# Patient Record
Sex: Male | Born: 1970 | Race: White | Hispanic: No | Marital: Single
Health system: Midwestern US, Community
[De-identification: ages and names within clinical notes are randomized; demographics above are authoritative.]

## PROBLEM LIST (undated history)

## (undated) DIAGNOSIS — M259 Joint disorder, unspecified: Secondary | ICD-10-CM

## (undated) DIAGNOSIS — M1380 Other specified arthritis, unspecified site: Secondary | ICD-10-CM

## (undated) DIAGNOSIS — Z6835 Body mass index (BMI) 35.0-35.9, adult: Secondary | ICD-10-CM

## (undated) DIAGNOSIS — E875 Hyperkalemia: Principal | ICD-10-CM

## (undated) DIAGNOSIS — E66812 Obesity, class 2: Principal | ICD-10-CM

## (undated) DIAGNOSIS — R1011 Right upper quadrant pain: Secondary | ICD-10-CM

## (undated) DIAGNOSIS — I2510 Atherosclerotic heart disease of native coronary artery without angina pectoris: Principal | ICD-10-CM

## (undated) DIAGNOSIS — Z6838 Body mass index (BMI) 38.0-38.9, adult: Secondary | ICD-10-CM

## (undated) DIAGNOSIS — K219 Gastro-esophageal reflux disease without esophagitis: Secondary | ICD-10-CM

## (undated) DIAGNOSIS — E559 Vitamin D deficiency, unspecified: Secondary | ICD-10-CM

## (undated) DIAGNOSIS — E6609 Other obesity due to excess calories: Secondary | ICD-10-CM

## (undated) HISTORY — PX: KNEE SURGERY: SHX244

## (undated) HISTORY — PX: BACK SURGERY: SHX140

## (undated) HISTORY — PX: WRIST SURGERY: SHX841

## (undated) HISTORY — PX: FRACTURE SURGERY: SHX138

---

## 2016-02-24 ENCOUNTER — Observation Stay
Admission: EM | Admit: 2016-02-24 | Discharge: 2016-03-01 | Disposition: A | Discharge status: Home / Self Care | Payer: Self-pay | Attending: Family Medicine | Admitting: Family Medicine

## 2016-02-24 ENCOUNTER — Emergency Department: Payer: Self-pay

## 2016-02-24 ENCOUNTER — Observation Stay: Payer: Self-pay | Admitting: Internal Medicine

## 2016-02-24 DIAGNOSIS — I10 Essential (primary) hypertension: Secondary | ICD-10-CM | POA: Insufficient documentation

## 2016-02-24 DIAGNOSIS — I209 Angina pectoris, unspecified: Secondary | ICD-10-CM

## 2016-02-24 DIAGNOSIS — R072 Precordial pain: Secondary | ICD-10-CM

## 2016-02-24 DIAGNOSIS — H532 Diplopia: Secondary | ICD-10-CM | POA: Insufficient documentation

## 2016-02-24 DIAGNOSIS — H4910 Fourth [trochlear] nerve palsy, unspecified eye: Secondary | ICD-10-CM | POA: Diagnosis present

## 2016-02-24 DIAGNOSIS — S40812A Abrasion of left upper arm, initial encounter: Secondary | ICD-10-CM

## 2016-02-24 DIAGNOSIS — S060X9A Concussion with loss of consciousness of unspecified duration, initial encounter: Secondary | ICD-10-CM | POA: Insufficient documentation

## 2016-02-24 DIAGNOSIS — S50812A Abrasion of left forearm, initial encounter: Secondary | ICD-10-CM | POA: Insufficient documentation

## 2016-02-24 DIAGNOSIS — E669 Obesity, unspecified: Secondary | ICD-10-CM | POA: Insufficient documentation

## 2016-02-24 DIAGNOSIS — S0990XA Unspecified injury of head, initial encounter: Secondary | ICD-10-CM

## 2016-02-24 DIAGNOSIS — R0789 Other chest pain: Principal | ICD-10-CM | POA: Insufficient documentation

## 2016-02-24 DIAGNOSIS — E785 Hyperlipidemia, unspecified: Secondary | ICD-10-CM | POA: Insufficient documentation

## 2016-02-24 DIAGNOSIS — Y9389 Activity, other specified: Secondary | ICD-10-CM | POA: Insufficient documentation

## 2016-02-24 DIAGNOSIS — Y92410 Unspecified street and highway as the place of occurrence of the external cause: Secondary | ICD-10-CM | POA: Insufficient documentation

## 2016-02-24 DIAGNOSIS — Y998 Other external cause status: Secondary | ICD-10-CM | POA: Insufficient documentation

## 2016-02-24 DIAGNOSIS — R55 Syncope and collapse: Secondary | ICD-10-CM | POA: Insufficient documentation

## 2016-02-24 DIAGNOSIS — R079 Chest pain, unspecified: Secondary | ICD-10-CM | POA: Diagnosis not present

## 2016-02-24 DIAGNOSIS — S20212A Contusion of left front wall of thorax, initial encounter: Secondary | ICD-10-CM

## 2016-02-24 DIAGNOSIS — S40212A Abrasion of left shoulder, initial encounter: Secondary | ICD-10-CM | POA: Insufficient documentation

## 2016-02-24 LAB — I-STAT CHEM 8 CARTRIDGE
Anion Gap I-Stat: 17 — ABNORMAL HIGH (ref 7–16)
BUN I-Stat: 10 mg/dL (ref 6–20)
Calcium Ionized I-Stat: 4.8 mg/dL (ref 4.35–5.10)
Chloride I-Stat: 101 mMol/L (ref 98–112)
Creatinine I-Stat: 0.9 mg/dL (ref 0.90–1.30)
EGFR: 105 mL/min/{1.73_m2} (ref 60–150)
Glucose I-Stat: 99 mg/dL (ref 70–99)
Hematocrit I-Stat: 44 % (ref 39.0–52.5)
Hemoglobin I-Stat: 15 gm/dL (ref 13.0–17.5)
Potassium I-Stat: 3.5 mMol/L (ref 3.5–5.3)
Sodium I-Stat: 139 mMol/L (ref 135–145)
TCO2 I-Stat: 25 mMol/L (ref 24–29)

## 2016-02-24 LAB — VH URINE DRUG SCREEN
Amphetamine: NEGATIVE
Barbiturates: NEGATIVE
Buprenorphine, Urine: NEGATIVE
Cannabinoids: NEGATIVE
Cocaine: NEGATIVE
Opiates: NEGATIVE
Phencyclidine: NEGATIVE
Urine Benzodiazepines: NEGATIVE
Urine Creatinine Random: 77.84 mg/dL
Urine Methadone Screen: NEGATIVE
Urine Oxycodone: NEGATIVE
Urine Specific Gravity: 1.013 (ref 1.001–1.040)
pH, Urine: 6.8 pH (ref 5.0–8.0)

## 2016-02-24 LAB — ECG 12-LEAD
P Wave Axis: 30 deg
P-R Interval: 155 ms
Patient Age: 44 years
Q-T Interval(Corrected): 435 ms
Q-T Interval: 359 ms
QRS Axis: -39 deg
QRS Duration: 111 ms
T Axis: 30 years
Ventricular Rate: 88 //min

## 2016-02-24 LAB — CBC AND DIFFERENTIAL
Basophils %: 0.4 % (ref 0.0–3.0)
Basophils Absolute: 0.1 10*3/uL (ref 0.0–0.3)
Eosinophils %: 1.2 % (ref 0.0–7.0)
Eosinophils Absolute: 0.2 10*3/uL (ref 0.0–0.8)
Hematocrit: 45.8 % (ref 39.0–52.5)
Hemoglobin: 15.2 gm/dL (ref 13.0–17.5)
Lymphocytes Absolute: 2.7 10*3/uL (ref 0.6–5.1)
Lymphocytes: 18 % (ref 15.0–46.0)
MCH: 29 pg (ref 28–35)
MCHC: 33 gm/dL (ref 32–36)
MCV: 86 fL (ref 80–100)
MPV: 7.5 fL (ref 6.0–10.0)
Monocytes Absolute: 0.8 10*3/uL (ref 0.1–1.7)
Monocytes: 5.7 % (ref 3.0–15.0)
Neutrophils %: 74.7 % (ref 42.0–78.0)
Neutrophils Absolute: 11 10*3/uL — ABNORMAL HIGH (ref 1.7–8.6)
PLT CT: 363 10*3/uL (ref 130–440)
RBC: 5.31 10*6/uL (ref 4.00–5.70)
RDW: 12.1 % (ref 11.0–14.0)
WBC: 14.8 10*3/uL — ABNORMAL HIGH (ref 4.0–11.0)

## 2016-02-24 LAB — I-STAT TROPONIN: Troponin I I-Stat: <0.02 ng/mL (ref 0.00–0.02)

## 2016-02-24 LAB — VH I-STAT TROPONIN NOTIFICATION

## 2016-02-24 LAB — VH I-STAT CHEM 8 NOTIFICATION

## 2016-02-24 MED ORDER — DIPHENHYDRAMINE HCL 50 MG/ML IJ SOLN
INTRAMUSCULAR | Status: AC
Start: 2016-02-24 — End: ?
  Filled 2016-02-24: qty 1

## 2016-02-24 MED ORDER — ONDANSETRON HCL 4 MG/2ML IJ SOLN
INTRAMUSCULAR | Status: AC
Start: 2016-02-24 — End: ?
  Filled 2016-02-24: qty 2

## 2016-02-24 MED ORDER — ONDANSETRON HCL 4 MG/2ML IJ SOLN
4.0000 mg | Freq: Once | INTRAMUSCULAR | Status: AC
Start: 2016-02-24 — End: 2016-02-24
  Administered 2016-02-24: 4 mg via INTRAVENOUS

## 2016-02-24 MED ORDER — DIPHENHYDRAMINE HCL 50 MG/ML IJ SOLN
25.0000 mg | Freq: Once | INTRAMUSCULAR | Status: AC
Start: 2016-02-24 — End: 2016-02-24
  Administered 2016-02-24: 25 mg via INTRAVENOUS

## 2016-02-24 MED ORDER — IOHEXOL 350 MG/ML IV SOLN
100.0000 mL | Freq: Once | INTRAVENOUS | Status: AC | PRN
Start: 2016-02-24 — End: 2016-02-24
  Administered 2016-02-24: 100 mL via INTRAVENOUS

## 2016-02-24 MED ORDER — MORPHINE SULFATE (PF) 2 MG/ML IV SOLN
INTRAVENOUS | Status: AC
Start: 2016-02-24 — End: ?
  Filled 2016-02-24: qty 2

## 2016-02-24 MED ORDER — MORPHINE SULFATE 2 MG/ML IJ/IV SOLN (WRAP)
4.0000 mg | Freq: Once | Status: AC
Start: 2016-02-24 — End: 2016-02-24
  Administered 2016-02-24: 4 mg via INTRAVENOUS
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
## 2016-02-24 NOTE — ED Notes (Signed)
c-collar taken off by Dr. Kerrie Pleasure. Pt A&Ox4.

## 2016-02-25 ENCOUNTER — Observation Stay: Payer: Self-pay

## 2016-02-25 DIAGNOSIS — E669 Obesity, unspecified: Secondary | ICD-10-CM | POA: Diagnosis present

## 2016-02-25 DIAGNOSIS — H4910 Fourth [trochlear] nerve palsy, unspecified eye: Secondary | ICD-10-CM | POA: Diagnosis present

## 2016-02-25 LAB — COMPREHENSIVE METABOLIC PANEL
ALT: 29 U/L (ref 0–55)
AST (SGOT): 21 U/L (ref 10–42)
Albumin/Globulin Ratio: 1.03 Ratio (ref 0.70–1.50)
Albumin: 3.4 gm/dL — ABNORMAL LOW (ref 3.5–5.0)
Alkaline Phosphatase: 111 U/L (ref 40–145)
Anion Gap: 12.6 mMol/L (ref 7.0–18.0)
BUN / Creatinine Ratio: 11.8 Ratio (ref 10.0–30.0)
BUN: 10 mg/dL (ref 7–22)
Bilirubin, Total: 0.5 mg/dL (ref 0.1–1.2)
CO2: 23.9 mMol/L (ref 20.0–30.0)
Calcium: 9.2 mg/dL (ref 8.5–10.5)
Chloride: 103 mMol/L (ref 98–110)
Creatinine: 0.85 mg/dL (ref 0.80–1.30)
EGFR: 107 mL/min/{1.73_m2} (ref 60–150)
Globulin: 3.3 gm/dL (ref 2.0–4.0)
Glucose: 106 mg/dL — ABNORMAL HIGH (ref 70–99)
Osmolality Calc: 270 mOsm/kg — ABNORMAL LOW (ref 275–300)
Potassium: 4.5 mMol/L (ref 3.5–5.3)
Protein, Total: 6.7 gm/dL (ref 6.0–8.3)
Sodium: 135 mMol/L — ABNORMAL LOW (ref 136–147)

## 2016-02-25 LAB — TROPONIN I
Troponin I: 0.01 ng/mL (ref 0.00–0.02)
Troponin I: 0.01 ng/mL (ref 0.00–0.02)
Troponin I: 0.01 ng/mL (ref 0.00–0.02)

## 2016-02-25 LAB — CBC AND DIFFERENTIAL
Basophils %: 0.1 % (ref 0.0–3.0)
Basophils Absolute: 0 10*3/uL (ref 0.0–0.3)
Eosinophils %: 0.2 % (ref 0.0–7.0)
Eosinophils Absolute: 0 10*3/uL (ref 0.0–0.8)
Hematocrit: 44.2 % (ref 39.0–52.5)
Hemoglobin: 15.1 gm/dL (ref 13.0–17.5)
Lymphocytes Absolute: 2.6 10*3/uL (ref 0.6–5.1)
Lymphocytes: 14.8 % — ABNORMAL LOW (ref 15.0–46.0)
MCH: 30 pg (ref 28–35)
MCHC: 34 gm/dL (ref 32–36)
MCV: 87 fL (ref 80–100)
MPV: 7.5 fL (ref 6.0–10.0)
Monocytes Absolute: 0.9 10*3/uL (ref 0.1–1.7)
Monocytes: 4.8 % (ref 3.0–15.0)
Neutrophils %: 80 % — ABNORMAL HIGH (ref 42.0–78.0)
Neutrophils Absolute: 14.1 10*3/uL — ABNORMAL HIGH (ref 1.7–8.6)
PLT CT: 330 10*3/uL (ref 130–440)
RBC: 5.07 10*6/uL (ref 4.00–5.70)
RDW: 12.1 % (ref 11.0–14.0)
WBC: 17.6 10*3/uL — ABNORMAL HIGH (ref 4.0–11.0)

## 2016-02-25 LAB — LIPID PANEL
Cholesterol: 173 mg/dL (ref 75–199)
Coronary Heart Disease Risk: 6.92
HDL: 25 mg/dL — ABNORMAL LOW (ref 40–55)
LDL Calculated: 93 mg/dL
Triglycerides: 274 mg/dL — ABNORMAL HIGH (ref 10–150)
VLDL: 55 — ABNORMAL HIGH (ref 0–40)

## 2016-02-25 LAB — HEMOGLOBIN A1C: Hgb A1C, %: 5 %

## 2016-02-25 MED ORDER — EYE PATCH MISC
1.0000 | Freq: Two times a day (BID) | Status: DC
Start: 2016-02-25 — End: 2016-03-01
  Administered 2016-02-25 – 2016-03-01 (×10): 1
  Filled 2016-02-25 (×4): qty 1

## 2016-02-25 MED ORDER — ALPRAZOLAM 0.25 MG PO TABS
0.2500 mg | ORAL_TABLET | Freq: Three times a day (TID) | ORAL | Status: DC | PRN
Start: 2016-02-25 — End: 2016-03-01

## 2016-02-25 MED ORDER — SODIUM CHLORIDE 0.9 % IJ SOLN
0.4000 mg | INTRAMUSCULAR | Status: DC | PRN
Start: 2016-02-25 — End: 2016-03-01

## 2016-02-25 MED ORDER — ONDANSETRON 4 MG PO TBDP
4.0000 mg | ORAL_TABLET | Freq: Three times a day (TID) | ORAL | Status: DC | PRN
Start: 2016-02-25 — End: 2016-03-01

## 2016-02-25 MED ORDER — NITROGLYCERIN 0.4 MG SL SUBL
0.4000 mg | SUBLINGUAL_TABLET | SUBLINGUAL | Status: DC | PRN
Start: 2016-02-25 — End: 2016-03-01

## 2016-02-25 MED ORDER — LEVETIRACETAM IN NACL 1000 MG/100ML IV SOLN
1000.0000 mg | Freq: Two times a day (BID) | INTRAVENOUS | Status: DC
Start: 2016-02-25 — End: 2016-02-25
  Administered 2016-02-25: 1000 mg via INTRAVENOUS
  Filled 2016-02-25 (×2): qty 100

## 2016-02-25 MED ORDER — VH PERFLUTREN LIPID MICROSPHERE 6.52 MG/ML IV SUSP
INTRAVENOUS | Status: AC
Start: 2016-02-25 — End: ?
  Filled 2016-02-25: qty 2

## 2016-02-25 MED ORDER — VH HEPARIN SODIUM (PORCINE) 5000 UNIT/ML IJ SOLN
5000.0000 [IU] | Freq: Two times a day (BID) | INTRAMUSCULAR | Status: DC
Start: 2016-02-25 — End: 2016-03-01
  Administered 2016-02-25 – 2016-02-29 (×12): 5000 [IU] via SUBCUTANEOUS
  Filled 2016-02-25 (×15): qty 1

## 2016-02-25 MED ORDER — LORAZEPAM 2 MG/ML IJ SOLN
1.0000 mg | INTRAMUSCULAR | Status: DC | PRN
Start: 2016-02-25 — End: 2016-03-01

## 2016-02-25 MED ORDER — PROCHLORPERAZINE EDISYLATE 5 MG/ML IJ SOLN
10.0000 mg | INTRAMUSCULAR | Status: DC | PRN
Start: 2016-02-25 — End: 2016-03-01

## 2016-02-25 MED ORDER — ACETAMINOPHEN 160 MG/5ML PO SOLN
650.0000 mg | ORAL | Status: DC | PRN
Start: 2016-02-25 — End: 2016-03-01

## 2016-02-25 MED ORDER — VH PERFLUTREN LIPID MICROSPHERE 6.52 MG/ML IV SUSP
Freq: Once | INTRAVENOUS | Status: AC
Start: 2016-02-25 — End: 2016-02-25
  Administered 2016-02-25: 2 mL via INTRAVENOUS

## 2016-02-25 MED ORDER — FAMOTIDINE 20 MG PO TABS
20.0000 mg | ORAL_TABLET | Freq: Two times a day (BID) | ORAL | Status: DC
Start: 2016-02-25 — End: 2016-03-01
  Administered 2016-02-25 – 2016-03-01 (×13): 20 mg via ORAL
  Filled 2016-02-25 (×16): qty 1

## 2016-02-25 MED ORDER — DIAZEPAM 10 MG RE GEL
5.0000 mg | RECTAL | Status: DC | PRN
Start: 2016-02-25 — End: 2016-03-01
  Filled 2016-02-25: qty 10

## 2016-02-25 MED ORDER — ACETAMINOPHEN 650 MG RE SUPP
650.0000 mg | RECTAL | Status: DC | PRN
Start: 2016-02-25 — End: 2016-03-01

## 2016-02-25 MED ORDER — TEMAZEPAM 15 MG PO CAPS
15.0000 mg | ORAL_CAPSULE | Freq: Every evening | ORAL | Status: DC | PRN
Start: 2016-02-25 — End: 2016-03-01

## 2016-02-25 MED ORDER — ASPIRIN 81 MG PO CHEW
81.0000 mg | CHEWABLE_TABLET | Freq: Every day | ORAL | Status: DC
Start: 2016-02-25 — End: 2016-03-01
  Administered 2016-02-25 – 2016-03-01 (×7): 81 mg via ORAL
  Filled 2016-02-25 (×9): qty 1

## 2016-02-25 MED ORDER — ACETAMINOPHEN 325 MG PO TABS
650.0000 mg | ORAL_TABLET | ORAL | Status: DC | PRN
Start: 2016-02-25 — End: 2016-03-01
  Administered 2016-03-01: 650 mg via ORAL
  Filled 2016-02-25: qty 2

## 2016-02-25 MED ORDER — IBUPROFEN 200 MG PO TABS
400.0000 mg | ORAL_TABLET | Freq: Three times a day (TID) | ORAL | Status: DC | PRN
Start: 2016-02-25 — End: 2016-03-01
  Administered 2016-02-28: 400 mg via ORAL
  Filled 2016-02-25: qty 2

## 2016-02-25 MED ORDER — ONDANSETRON HCL 4 MG/2ML IJ SOLN
4.0000 mg | INTRAMUSCULAR | Status: DC | PRN
Start: 2016-02-25 — End: 2016-03-01

## 2016-02-25 MED ORDER — NITROGLYCERIN 2 % TD OINT
0.5000 [in_us] | TOPICAL_OINTMENT | TRANSDERMAL | Status: DC
Start: 2016-02-25 — End: 2016-02-26
  Administered 2016-02-25 – 2016-02-26 (×5): 0.5 [in_us] via TOPICAL
  Filled 2016-02-25 (×9): qty 1

## 2016-02-25 MED ORDER — MIDAZOLAM HCL 2 MG/2ML IJ SOLN
2.0000 mg | INTRAMUSCULAR | Status: DC | PRN
Start: 2016-02-25 — End: 2016-03-01

## 2016-02-25 MED ORDER — SODIUM CHLORIDE 0.9 % IV SOLN
INTRAVENOUS | Status: DC
Start: 2016-02-25 — End: 2016-02-26

## 2016-02-25 MED ORDER — LEVETIRACETAM IN NACL 1500 MG/100ML IV SOLN
1500.0000 mg | Freq: Once | INTRAVENOUS | Status: AC
Start: 2016-02-25 — End: 2016-02-25
  Administered 2016-02-25: 1500 mg via INTRAVENOUS
  Filled 2016-02-25: qty 100

## 2016-02-25 MED ORDER — OXYCODONE-ACETAMINOPHEN 5-325 MG PO TABS
1.0000 | ORAL_TABLET | ORAL | Status: DC | PRN
Start: 2016-02-25 — End: 2016-03-01
  Administered 2016-02-25 – 2016-03-01 (×14): 2 via ORAL
  Filled 2016-02-25 (×14): qty 2

## 2016-02-25 MED ORDER — MORPHINE SULFATE 2 MG/ML IJ/IV SOLN (WRAP)
2.0000 mg | Status: DC | PRN
Start: 2016-02-25 — End: 2016-03-01
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
## 2016-02-25 NOTE — PT Plan of Care Note (Signed)
[facility]  [facility]  [address]  [place] [address]  Department of Rehabilitation Services  [phone]  ZAKERY NORMINGTON CSN: [id number]  SURG TELEMETRY STEP-DOWN 335/335-A    Physical Therapy General Note  Time: 8:25 a.m.    Last seen by a Physical Therapist vs. PTA: N/A    Attempted to see patient for PT evaluation; Off floor at echo at this time, per RN.    Team Communication: RN- Crystal    Plan: Will follow up later today, per POC.     Haig Prophet, PT, DPT
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
## 2016-02-25 NOTE — ED Notes (Signed)
Patient to be admitted, VS and assessment as noted. Patient agreeable to current POC including admission and further treatment. All questions and concerns addressed @ this time. All belongings to be sent with patient.  Pt connected to tele box 401 and monitor room notified.

## 2016-02-26 LAB — BASIC METABOLIC PANEL
Anion Gap: 8.7 mMol/L (ref 7.0–18.0)
BUN / Creatinine Ratio: 15.4 Ratio (ref 10.0–30.0)
BUN: 14 mg/dL (ref 7–22)
CO2: 26.8 mMol/L (ref 20.0–30.0)
Calcium: 8.5 mg/dL (ref 8.5–10.5)
Chloride: 106 mMol/L (ref 98–110)
Creatinine: 0.91 mg/dL (ref 0.80–1.30)
EGFR: 104 mL/min/{1.73_m2} (ref 60–150)
Glucose: 100 mg/dL — ABNORMAL HIGH (ref 70–99)
Osmolality Calc: 274 mOsm/kg — ABNORMAL LOW (ref 275–300)
Potassium: 4.5 mMol/L (ref 3.5–5.3)
Sodium: 137 mMol/L (ref 136–147)

## 2016-02-26 LAB — CBC AND DIFFERENTIAL
Basophils %: 0.5 % (ref 0.0–3.0)
Basophils Absolute: 0.1 10*3/uL (ref 0.0–0.3)
Eosinophils %: 2.4 % (ref 0.0–7.0)
Eosinophils Absolute: 0.2 10*3/uL (ref 0.0–0.8)
Hematocrit: 40.1 % (ref 39.0–52.5)
Hemoglobin: 13.3 gm/dL (ref 13.0–17.5)
Lymphocytes Absolute: 2.9 10*3/uL (ref 0.6–5.1)
Lymphocytes: 28.8 % (ref 15.0–46.0)
MCH: 30 pg (ref 28–35)
MCHC: 33 gm/dL (ref 32–36)
MCV: 89 fL (ref 80–100)
MPV: 7.4 fL (ref 6.0–10.0)
Monocytes Absolute: 0.6 10*3/uL (ref 0.1–1.7)
Monocytes: 5.9 % (ref 3.0–15.0)
Neutrophils %: 62.3 % (ref 42.0–78.0)
Neutrophils Absolute: 6.2 10*3/uL (ref 1.7–8.6)
PLT CT: 288 10*3/uL (ref 130–440)
RBC: 4.5 10*6/uL (ref 4.00–5.70)
RDW: 12.1 % (ref 11.0–14.0)
WBC: 10 10*3/uL (ref 4.0–11.0)

## 2016-02-26 LAB — MAGNESIUM: Magnesium: 2 mg/dL (ref 1.6–2.6)

## 2016-02-26 LAB — PHOSPHORUS: Phosphorus: 2.9 mg/dL (ref 2.3–4.7)
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
## 2016-02-26 NOTE — Plan of Care (Signed)
Problem: Safety  Goal: Patient will be free from injury during hospitalization  Outcome: Progressing    Problem: Pain  Goal: Patient's pain/discomfort is manageable  Outcome: Progressing

## 2016-02-26 NOTE — Plan of Care (Signed)
Problem: Pain  Goal: Patient's pain/discomfort is manageable  Outcome: Progressing
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
## 2016-02-26 NOTE — Procedures (Signed)
DATE OF SERVICE: 02/25/2016    PROCEDURE TYPE: EEG performed during wakefulness, drowsiness and  sleep.    PROCEDURE NUMBER: [id number]    REQUESTING PHYSICIAN: Colon Flattery. Nunzio Cory, MD.    MEDICATIONS: Xanax, p.r.n. Diastat, Motrin, Keppra, Restoril,  Benadryl.    CLINICAL: This routine EEG was performed, for this 45 year old  man with spell of loss of consciousness, to assess for seizures  and epilepsy as a contributing etiology.    FINDINGS: The study was performed during wakefulness, drowsiness  and sleep.  The predominant activity of wakefulness with eye  closure was a posterior dominant, low amplitude, 10 CPS activity  that was reactive.  There was no focal slowing nor any  epileptiform discharges.  Drowsiness was attained as manifested  by centrally placed vertex sharp waves.  Clinical sleep was also  attained.    Hyperventilation was not performed due to the inability of the  patient to participate.    Photic stimulation was not performed.    The EKG tracing identified a sinus rhythm.    CLINICAL INTERPRETATION: This routine electroencephalogram  performed during wakefulness, drowsiness and sleep was normal.        [id number]  DD: 02/26/2016 08:45:17  DT: 02/26/2016 09:04:03  JOB: [id number]

## 2016-02-26 NOTE — Plan of Care (Signed)
Problem: Psychosocial and Spiritual Needs  Goal: Demonstrates ability to cope with hospitalization/illness  Outcome: Progressing

## 2016-02-27 MED ORDER — EYE PATCH MISC
1.0000 | Freq: Two times a day (BID) | Status: AC
Start: 2016-02-27 — End: ?

## 2016-02-27 NOTE — Plan of Care (Signed)
Problem: Pain  Goal: Patient's pain/discomfort is manageable  Outcome: Progressing

## 2016-02-27 NOTE — Plan of Care (Signed)
Problem: Psychosocial and Spiritual Needs  Goal: Demonstrates ability to cope with hospitalization/illness  Outcome: Progressing

## 2016-02-27 NOTE — Progress Notes (Signed)
Assessment completed and documented. Medicines given per MAR. Patient assisted with long distance call. VSS. No further needs at this time. Patient's call bell and telephone within reach. Will continue to monitor.
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
## 2016-02-27 NOTE — Progress Notes (Signed)
Case management consulted regarding patient needing portable cardiac monitor. Patient lives four hours from this facility and will not be following up with our physicians. This unique situation was discussed with the case manager on call and the attending physician and it was decided that the patient will not be discharged today and will instead stay until tomorrow when a referral to a local cardiologist could be arranged.

## 2016-02-27 NOTE — Progress Notes (Signed)
Report received from night shift nurse. Care of patient assumed. Assessment to follow.

## 2016-02-27 NOTE — Plan of Care (Signed)
Problem: Safety  Goal: Patient will be free from injury during hospitalization  Outcome: Progressing    Problem: Pain  Goal: Patient's pain/discomfort is manageable  Outcome: Progressing    Problem: Neurological Deficit  Goal: Neurological status is stable or improving  Outcome: Progressing

## 2016-02-28 ENCOUNTER — Encounter
Admission: EM | Disposition: A | Discharge status: Home / Self Care | Payer: Self-pay | Source: Home / Self Care | Attending: Emergency Medicine

## 2016-02-28 ENCOUNTER — Observation Stay: Payer: Self-pay

## 2016-02-28 SURGERY — LEFT HEART CATH
Anesthesia: Conscious Sedation | Site: Wrist | Laterality: Right

## 2016-02-28 MED ORDER — SODIUM CHLORIDE 0.9 % IV SOLN
INTRAVENOUS | Status: AC
Start: 2016-02-29 — End: 2016-02-29

## 2016-02-28 MED ORDER — TECHNETIUM TC 99M SESTAMIBI - CARDIOLITE
10.8000 | Freq: Once | Status: AC | PRN
Start: 2016-02-28 — End: 2016-02-28
  Administered 2016-02-28: 11 via INTRAVENOUS

## 2016-02-28 MED ORDER — REGADENOSON 0.4 MG/5ML IV SOLN
0.4000 mg | Freq: Once | INTRAVENOUS | Status: AC
Start: 2016-02-28 — End: 2016-02-28
  Administered 2016-02-28: 0.4 mg via INTRAVENOUS
  Filled 2016-02-28: qty 5

## 2016-02-28 MED ORDER — ATORVASTATIN CALCIUM 40 MG PO TABS
40.0000 mg | ORAL_TABLET | Freq: Every evening | ORAL | Status: DC
Start: 2016-02-28 — End: 2016-03-01
  Administered 2016-02-28 – 2016-02-29 (×2): 40 mg via ORAL
  Filled 2016-02-28 (×4): qty 1

## 2016-02-28 MED ORDER — SODIUM CHLORIDE 0.9 % IV SOLN
INTRAVENOUS | Status: DC
Start: 2016-02-29 — End: 2016-02-29

## 2016-02-28 MED ORDER — REGADENOSON 0.4 MG/5ML IV SOLN
INTRAVENOUS | Status: AC
Start: 2016-02-28 — End: ?
  Filled 2016-02-28: qty 5

## 2016-02-28 MED ORDER — TECHNETIUM TC 99M SESTAMIBI - CARDIOLITE
32.0000 | Freq: Once | Status: AC | PRN
Start: 2016-02-28 — End: 2016-02-28
  Administered 2016-02-28: 32 via INTRAVENOUS

## 2016-02-28 NOTE — Consults (Signed)
Order noted. Discussed with MD, CM unable to arrange holter monitor due to following barriers: Pt lives in [place], [place] which is 4 hours away, pt uncertain that he would be able to drive up to [place] for f/u; 2) uninsured; 3)lacks regular primary physician or cardiologist. Pt stated he just goes to the [place] in [place] in their ER if anything happens and has been to [facility] in [place], [place] once. Pt anticipates difficulty returning to [facility] for f/u.     Pt was informed of the barriers above and verbalized understanding of these barriers.

## 2016-02-28 NOTE — Consults (Signed)
INITIAL CONSULTATION  [facility] CARDIOLOGY & VASCULAR MEDICINE    Date Time: 02/28/2016 4:59 PM  Patient Name: Marc Fernandez, Marc Fernandez  MRN#: [id number]  DOB: [birth year]  Requesting Physician: Brantley Persons, MD  PCP: No primary care provider on file.    Reason for Consultation:   Cp, syncope and abnormal Nuc stress test    History:   Marc Fernandez is a 45 y.o. male   Echo-The left ventricular cavity size, wall thickness, and systolic function are normal with no regional wall motion abnormalities present.   The left ventricular ejection fraction is normal, estimated at 55-60%. Diastolic function was not completely evaluated.   The right ventricular size is moderately increased. The right ventricular systolic function is low normal.   The aortic root and proximal ascending aorta are mildly dilated.   There is no significant valvular heart disease.     Lexiscan nuc stress-large infarction no ischemia with EF 53% hypokinesis of the inferior, apical and inferioapical segments.  Cardiac risk-htn, mild dyslipidemia, unknown fm hx of cad. Nonsmoker. No dm. Denies drug use.  Past Medical History:   History reviewed. No pertinent past medical history.    Past Surgical History:     Past Surgical History   Procedure Laterality Date   . Back surgery       I&D'd "for an infection"   . Knee surgery     . Wrist surgery         Family History:   History reviewed. No pertinent family history.    Social History:     Social History     Social History   . Marital Status: Single     Spouse Name: N/A   . Number of Children: N/A   . Years of Education: N/A     Social History Main Topics   . Smoking status: Never Smoker    . Smokeless tobacco: Never Used   . Alcohol Use: No      Comment: denies    . Drug Use: No      Comment: denies    . Sexual Activity: Not on file     Other Topics Concern   . Not on file     Social History Narrative   single has girlfriend. Nonsmoker no drug use. Drives large truck.    Allergies:   No Known  Allergies    Medications:     Current Facility-Administered Medications   Medication Dose Route Frequency Provider Last Rate Last Dose   . acetaminophen (TYLENOL) tablet 650 mg  650 mg Oral Q4H PRN Ester Rink, MD        Or   . acetaminophen (TYLENOL) 160 MG/5ML oral solution 650 mg  650 mg per NG tube Q4H PRN Ester Rink, MD        Or   . acetaminophen (TYLENOL) suppository 650 mg  650 mg Rectal Q4H PRN Ester Rink, MD       . ALPRAZolam Prudy Feeler) tablet 0.25 mg  0.25 mg Oral Q8H PRN Ester Rink, MD       . aspirin chewable tablet 81 mg  81 mg Oral Daily Ester Rink, MD   81 mg at 02/28/16 0911   . atorvastatin (LIPITOR) tablet 40 mg  40 mg Oral QHS Brantley Persons, MD       . LORazepam (ATIVAN) injection 1 mg  1 mg Intravenous Q5 Min PRN Ester Rink, MD  Or   . midazolam (VERSED) injection 2 mg  2 mg Intramuscular Q5 Min PRN Ester Rink, MD        Or   . diazePAM (DIASTAT ACUDIAL) rectal kit 5 mg  5 mg Rectal Q5 Min PRN Ester Rink, MD       . Eye Patch MISC 1 patch  1 patch Does not apply Q12H Giurgiutiu, Dan-Victor, MD   1 patch at 02/28/16 0533   . famotidine (PEPCID) tablet 20 mg  20 mg Oral Q12H SCH Ester Rink, MD   20 mg at 02/28/16 0911   . heparin (porcine) (Heparin Sodium (Porcine)) injection 5,000 Units  5,000 Units Subcutaneous Q12H [facility] Ester Rink, MD   5,000 Units at 02/28/16 0911   . ibuprofen (ADVIL,MOTRIN) tablet 400 mg  400 mg Oral TID PRN Ester Rink, MD       . morphine injection 2 mg  2 mg Intravenous Q2H PRN Ester Rink, MD       . naloxone [facility]) injection 0.4 mg  0.4 mg Intravenous PRN Ester Rink, MD       . nitroglycerin (NITROSTAT) SL tablet 0.4 mg  0.4 mg Sublingual Q5 Min PRN Ester Rink, MD       . ondansetron (ZOFRAN-ODT) disintegrating tablet 4 mg  4 mg Oral Q8H PRN Ester Rink, MD        Or   . ondansetron [facility]) injection 4 mg  4 mg Intravenous Q4H PRN Ester Rink, MD       . oxyCODONE-acetaminophen  (PERCOCET) 5-325 MG per tablet 1-2 tablet  1-2 tablet Oral Q4H PRN Ester Rink, MD   2 tablet at 02/28/16 0909   . prochlorperazine (COMPAZINE) injection 10 mg  10 mg Intravenous Q4H PRN Ester Rink, MD       . temazepam (RESTORIL) capsule 15 mg  15 mg Oral QHS PRN Ester Rink, MD           Review of Systems:   A comprehensive review of systems was: Otherwise negative except as stated above. Diplopia/abrasions/bruising unable to walk bruce protocol lexiscan nuc    Physical Exam:     Filed Vitals:    02/28/16 1438   BP: 146/83   Pulse: 94   Temp:    Resp: 20   SpO2:        Intake and Output Summary (Last 24 hours) at Date Time    Intake/Output Summary (Last 24 hours) at 02/28/16 1659  Last data filed at 02/28/16 1100   Gross per 24 hour   Intake    750 ml   Output    550 ml   Net    200 ml       General appearance - alert, well appearing, and in no distress  Neck - supple, no significant adenopathy, no thyromegaly, no masses  Chest - clear to auscultation, no wheezes, rales or rhonchi  Heart - normal rate, regular rhythm, normal S1, S2, no murmurs, rubs, clicks or gallops  Abdomen - soft, nontender, nondistended, no masses or organomegaly  Neurological - alert, oriented, cranial nerves grossly intact, moves all extremities well.  Extremities - peripheral pulses normal, no pedal edema, no clubbing or cyanosis  Skin - normal coloration and turgor, no rashes, no suspicious skin lesions noted      Labs Reviewed:     Recent Labs  Lab 02/26/16  1333 02/25/16  0155 02/24/16  2050  GLUCOSE 100* 106*  --    BUN 14 10  --    CREATININE 0.91 0.85  --    I-STAT CREATININE  --   --  0.90   SODIUM 137 135*  --    POTASSIUM 4.5 4.5  --    CHLORIDE 106 103  --    CO2 26.8 23.9  --    MAGNESIUM 2.0  --   --    AST (SGOT)  --  21  --    ALT  --  29  --          Recent Labs  Lab 02/25/16  0155   CHOLESTEROL 173   TRIGLYCERIDES 274*   HDL 25*   LDL CALCULATED 93         Recent Labs  Lab 02/26/16  1333 02/25/16  0155  02/24/16  2038   WBC 10.0 17.6* 14.8*   HEMOGLOBIN 13.3 15.1 15.2   HEMATOCRIT 40.1 44.2 45.8   PLT CT 288 330 363         Recent Labs  Lab 02/25/16  1050 02/25/16  0439 02/25/16  0155   TROPONIN I 0.01 0.01 0.01                       Radiology:   No results found.    Assessment/Recommendations:   1. Acute cp, which probably let to vasovagal syncope. Will need 30d monitor   2. Abnormal nuc stress with fixed inferior defect cath tomorrow given his occupation and extent of accident  3. htn  4. Hyperlipidemia, mild Carotid bilat disease would start statin therapy    Thank you very much for this consultation. We will continue to follow along with you.    Norville Haggard, MD

## 2016-02-28 NOTE — PT Plan of Care Note (Signed)
[facility]  [facility]  [address]  [place] [address]  Department of Rehabilitation Services  [phone]  SHARRIEFF SPRATLIN CSN: [id number]  SURG TELEMETRY STEP-DOWN 335/335-A    Physical Therapy General Note  Time: 3:32 p.m.    Last seen by a Physical Therapist vs. PTA: 02/25/16    Attempted to see patient for PT treatment; Patient continues to be off floor for stress test at this time.    Team Communication: RN- Vickie    Plan: Will follow up tomorrow, per POC.     Haig Prophet, PT, DPT

## 2016-02-28 NOTE — OT Plan of Care Note (Signed)
[facility]    [facility]  [address]  [place] [address]  Department of Rehabilitation Services  [phone]    Occupational Therapy General Note    Pt name: Marc Fernandez CSN: [id number]    Time: 13:34    Attempted to see patient at this time; however, patient is off the unit for test/procedure. Also attempted to see the patient in the morning but MD in with patient at that time.    Plan: Will follow up later today schedule permitting.    Allie Dimmer, MS, OTR/L
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
## 2016-02-28 NOTE — PT Plan of Care Note (Signed)
[facility]  [facility]  [address]  [place] [address]  Department of Rehabilitation Services  [phone]  KORTEZ MURTAGH CSN: [id number]  SURG TELEMETRY STEP-DOWN 335/335-A    Physical Therapy General Note  Time: 1:52 p.m.    Attempted to see patient for PT treatment; Patient off floor for testing at this time.    Team Communication: OTNettie Elm    Plan: Will follow up later today as schedule permits, per POC.     Haig Prophet, PT, DPT
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
## 2016-02-28 NOTE — Progress Notes (Signed)
NM cardiac stress test completed. Lexiscan 0.4mg IV given over 20 seconds during stress test. Tolerated well.    Kiam Bransfield, RN, RRT  Nuclear Card Stress Lab RN  [phone]

## 2016-02-28 NOTE — Plan of Care (Signed)
Pt resting in bed, NPO for stress test

## 2016-02-28 NOTE — Progress Notes (Signed)
02/28/16 1503   Case Management Quick Doc   Case Management Assessment Status Assessment Complete   CM Comments 4/10 RNCM--Stress test today. Possible [place] today pending stress test results. Pt to return home, girlfriend to transport home.Pt declined home health.  Indigent med form  in shadow chart if needed for new meds except controlled substances. [facility] pharmacy closes at 8pm. CM to follow and assist as needed   Expected Discharge Date 02/29/16   Physical Discharge Disposition Home, No Needs   Discharge Disposition   Mode of Transportation Car     Calla Kicks, RN, BSN  Case Manager  [phone]  [phone]: fax

## 2016-02-28 NOTE — Progress Notes (Signed)
Lexiscan Nuclear Stress Test: 0.4 mg Lexiscan injected over 20 sec; no chest pain; no ST changes; imaging and final results pending.    Koleen Celia, MS

## 2016-02-29 ENCOUNTER — Encounter
Admission: EM | Disposition: A | Discharge status: Home / Self Care | Payer: Self-pay | Source: Home / Self Care | Attending: Emergency Medicine

## 2016-02-29 SURGERY — LEFT HEART CATH POSS PCI
Anesthesia: Conscious Sedation | Site: Wrist | Laterality: Right

## 2016-02-29 MED ORDER — MIDAZOLAM HCL 2 MG/2ML IJ SOLN
INTRAMUSCULAR | Status: AC
Start: 2016-02-29 — End: 2016-02-29
  Administered 2016-02-29: 2 mg via INTRAVENOUS
  Filled 2016-02-29: qty 2

## 2016-02-29 MED ORDER — VERAPAMIL HCL 2.5 MG/ML IV SOLN
INTRAVENOUS | Status: AC
Start: 2016-02-29 — End: 2016-02-29
  Administered 2016-02-29: 10 mg via INTRA_ARTERIAL
  Filled 2016-02-29: qty 4

## 2016-02-29 MED ORDER — FENTANYL CITRATE (PF) 50 MCG/ML IJ SOLN (WRAP)
INTRAMUSCULAR | Status: AC
Start: 2016-02-29 — End: 2016-02-29
  Administered 2016-02-29: 50 ug via INTRAVENOUS
  Filled 2016-02-29: qty 2

## 2016-02-29 MED ORDER — LIDOCAINE HCL 1 % IJ SOLN
INTRAMUSCULAR | Status: AC
Start: 2016-02-29 — End: 2016-02-29
  Administered 2016-02-29: 5 mL via SUBCUTANEOUS
  Filled 2016-02-29: qty 20

## 2016-02-29 MED ORDER — HEPARIN SODIUM (PORCINE) 1000 UNIT/ML IJ SOLN
INTRAMUSCULAR | Status: AC
Start: 2016-02-29 — End: 2016-03-01
  Filled 2016-02-29: qty 10

## 2016-02-29 MED ORDER — HEPARIN (PORCINE) IN NACL 2-0.9 UNIT/ML-% IJ SOLN
INTRAMUSCULAR | Status: AC
Start: 2016-02-29 — End: 2016-02-29
  Filled 2016-02-29: qty 1000

## 2016-02-29 MED ORDER — IODIXANOL 320 MG/ML IV SOLN
50.0000 mL | Freq: Once | INTRAVENOUS | Status: AC
Start: 2016-02-29 — End: 2016-02-29
  Administered 2016-02-29: 50 mL via INTRA_ARTERIAL

## 2016-02-29 MED ORDER — VH HEPARIN 10,000 UNITS/1000 ML NS
INJECTION | INTRAVENOUS | Status: AC
Start: 2016-02-29 — End: 2016-02-29
  Filled 2016-02-29: qty 1000
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
## 2016-02-29 NOTE — Progress Notes (Signed)
02/29/16 1001   Case Management Quick Doc   Case Management Assessment Status Assessment Complete   CM Comments 4/11 RNCM--NPO for hearth cath today. Plan to return home, girlfriend to transport home. Home health was declined. Indigent med form in shadow chart if needed for new meds except controlled substances. CM to follow and assist as needed.   Expected Discharge Date 03/01/16   Discharge Disposition   Mode of Transportation Car     Calla Kicks, [place], BSN  Case Manager  [phone]  [phone]: fax

## 2016-02-29 NOTE — Plan of Care (Signed)
Problem: Health Promotion  Goal: Knowledge - disease process  Extent of understanding conveyed about a specific disease process.   Outcome: Progressing

## 2016-02-29 NOTE — Plan of Care (Signed)
Pt may have lite breakfast, then NPO for cath, consent signed
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
## 2016-02-29 NOTE — Progress Notes (Signed)
R radial Allen's test okay with pulse ox  Mallampati 2
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
## 2016-02-29 NOTE — Progress Notes (Signed)
Cardiology Progress Note      Date Time: 02/29/2016 7:13 AM  Patient Name: Marc Fernandez, Marc Fernandez DOB:  Oct 18, 1971  Attending:  Brantley Persons, MD    Subjective:   No further chest pain. Reviewed his stress test / echo results. He is scheduled for cardiac catheterization today.       Physical Exam:   Temp:  [97.3 F (36.3 C)-98.3 F (36.8 C)] 97.5 F (36.4 C)  Heart Rate:  [56-94] 59  Resp Rate:  [17-20] 17  BP: (98-146)/(63-94) 98/63 mmHg  Temp (24hrs), Avg:97.7 F (36.5 C), Min:97.3 F (36.3 C), Max:98.3 F (36.8 C)    127.6 kg (281 lb 4.9 oz)   Intake and Output Summary (Last 24 hours):    Intake/Output Summary (Last 24 hours) at 02/29/16 0713  Last data filed at 02/29/16 0500   Gross per 24 hour   Intake    740 ml   Output    250 ml   Net    490 ml       General: Awake, alert and oriented x3.  Cardiovascular: Regular rate and rhythm, normal S1/S2, no appreciable murmur  Pulmonary: Clear to auscultation bilaterally  Abdomen: soft, non-tender and non-distended with normal bowel sounds  Extremities:Warm and well-perfused, no cyanosis, clubbing or edema  Right allen test normal    Medications:     Current Facility-Administered Medications   Medication Dose Route Frequency Provider Last Rate Last Dose   . 0.9%  NaCl infusion   Intravenous Continuous Priscella Mann L, PA 100 mL/hr at 02/29/16 0601     . 0.9%  NaCl infusion   Intravenous Continuous Brien Few, MD       . acetaminophen (TYLENOL) tablet 650 mg  650 mg Oral Q4H PRN Ester Rink, MD        Or   . acetaminophen (TYLENOL) 160 MG/5ML oral solution 650 mg  650 mg per NG tube Q4H PRN Ester Rink, MD        Or   . acetaminophen (TYLENOL) suppository 650 mg  650 mg Rectal Q4H PRN Ester Rink, MD       . ALPRAZolam Prudy Feeler) tablet 0.25 mg  0.25 mg Oral Q8H PRN Ester Rink, MD       . aspirin chewable tablet 81 mg  81 mg Oral Daily Ester Rink, MD   81 mg at 02/28/16 0911   . atorvastatin (LIPITOR) tablet 40 mg  40 mg Oral QHS Brantley Persons, MD   40 mg at 02/28/16 2316   . LORazepam (ATIVAN) injection 1 mg  1 mg Intravenous Q5 Min PRN Ester Rink, MD        Or   . midazolam (VERSED) injection 2 mg  2 mg Intramuscular Q5 Min PRN Ester Rink, MD        Or   . diazePAM (DIASTAT ACUDIAL) rectal kit 5 mg  5 mg Rectal Q5 Min PRN Ester Rink, MD       . Eye Patch MISC 1 patch  1 patch Does not apply Q12H Giurgiutiu, Dan-Victor, MD   1 patch at 02/29/16 0400   . famotidine (PEPCID) tablet 20 mg  20 mg Oral Q12H SCH Ester Rink, MD   20 mg at 02/28/16 2258   . heparin (porcine) (Heparin Sodium (Porcine)) injection 5,000 Units  5,000 Units Subcutaneous Q12H [facility] Ester Rink, MD   5,000 Units at 02/28/16 2258   . ibuprofen (  ADVIL,MOTRIN) tablet 400 mg  400 mg Oral TID PRN Ester Rink, MD   400 mg at 02/28/16 2258   . morphine injection 2 mg  2 mg Intravenous Q2H PRN Ester Rink, MD       . naloxone [facility]) injection 0.4 mg  0.4 mg Intravenous PRN Ester Rink, MD       . nitroglycerin (NITROSTAT) SL tablet 0.4 mg  0.4 mg Sublingual Q5 Min PRN Ester Rink, MD       . ondansetron (ZOFRAN-ODT) disintegrating tablet 4 mg  4 mg Oral Q8H PRN Ester Rink, MD        Or   . ondansetron [facility]) injection 4 mg  4 mg Intravenous Q4H PRN Ester Rink, MD       . oxyCODONE-acetaminophen (PERCOCET) 5-325 MG per tablet 1-2 tablet  1-2 tablet Oral Q4H PRN Ester Rink, MD   2 tablet at 02/28/16 2005   . prochlorperazine (COMPAZINE) injection 10 mg  10 mg Intravenous Q4H PRN Ester Rink, MD       . temazepam (RESTORIL) capsule 15 mg  15 mg Oral QHS PRN Ester Rink, MD           Labs and Studies:     Recent Labs  Lab 02/26/16  1333 02/25/16  0155 02/24/16  2038   WBC 10.0 17.6* 14.8*   HEMOGLOBIN 13.3 15.1 15.2   HEMATOCRIT 40.1 44.2 45.8   PLT CT 288 330 363       Recent Labs  Lab 02/26/16  1333 02/25/16  0155  02/24/16  2050   GLUCOSE 100* 106* More results in Results Review  --    BUN 14 10  --   --    CREATININE 0.91  0.85  --   --    I-STAT CREATININE  --   --   --  0.90   SODIUM 137 135* More results in Results Review  --    POTASSIUM 4.5 4.5 More results in Results Review  --    CHLORIDE 106 103 More results in Results Review  --    CO2 26.8 23.9 More results in Results Review  --    MAGNESIUM 2.0  --   --   --    AST (SGOT)  --  21  --   --    ALT  --  29  --   --    More results in Results Review = values in this interval not displayed.    Recent Labs  Lab 02/25/16  0155   CHOLESTEROL 173   TRIGLYCERIDES 274*   HDL 25*   LDL CALCULATED 93           Invalid input(s): TROPONINT  No results found for: DDIMER  No results found for: BNP  No results found for: HGBA1C          Radiology:   No results found.    Impression and Plan:   1. CAD-Lexiscan nuclear stress test with large, severe intensity perfusion defect in mid to distal anterior, apical and inferoapical segments.No reversibility EF 53% thinning and hypokinesis of the inferior, apical and inferior apical segments. He is on aspirin and statin therapy. No bblocker hr have been in the 50s. Consent signed and reviewed.  2. Hyperlipidemia started on Lipitor  3. Syncope, no arrythmias.       Priscella Mann [facility]  [address]  [place],  [address]  [phone]

## 2016-03-01 ENCOUNTER — Observation Stay: Payer: Self-pay

## 2016-03-01 LAB — BASIC METABOLIC PANEL
Anion Gap: 13.6 mMol/L (ref 7.0–18.0)
BUN / Creatinine Ratio: 16 Ratio (ref 10.0–30.0)
BUN: 16 mg/dL (ref 7–22)
CO2: 21.5 mMol/L (ref 20.0–30.0)
Calcium: 9 mg/dL (ref 8.5–10.5)
Chloride: 105 mMol/L (ref 98–110)
Creatinine: 1 mg/dL (ref 0.80–1.30)
EGFR: 92 mL/min/{1.73_m2} (ref 60–150)
Glucose: 100 mg/dL — ABNORMAL HIGH (ref 70–99)
Osmolality Calc: 273 mOsm/kg — ABNORMAL LOW (ref 275–300)
Potassium: 4.1 mMol/L (ref 3.5–5.3)
Sodium: 136 mMol/L (ref 136–147)

## 2016-03-01 LAB — CBC AND DIFFERENTIAL
Basophils %: 0.5 % (ref 0.0–3.0)
Basophils Absolute: 0.1 10*3/uL (ref 0.0–0.3)
Eosinophils %: 2.3 % (ref 0.0–7.0)
Eosinophils Absolute: 0.2 10*3/uL (ref 0.0–0.8)
Hematocrit: 40.7 % (ref 39.0–52.5)
Hemoglobin: 14.1 gm/dL (ref 13.0–17.5)
Lymphocytes Absolute: 2.4 10*3/uL (ref 0.6–5.1)
Lymphocytes: 21.8 % (ref 15.0–46.0)
MCH: 30 pg (ref 28–35)
MCHC: 35 gm/dL (ref 32–36)
MCV: 87 fL (ref 80–100)
MPV: 7.6 fL (ref 6.0–10.0)
Monocytes Absolute: 0.6 10*3/uL (ref 0.1–1.7)
Monocytes: 5.7 % (ref 3.0–15.0)
Neutrophils %: 69.7 % (ref 42.0–78.0)
Neutrophils Absolute: 7.5 10*3/uL (ref 1.7–8.6)
PLT CT: 322 10*3/uL (ref 130–440)
RBC: 4.68 10*6/uL (ref 4.00–5.70)
RDW: 12.1 % (ref 11.0–14.0)
WBC: 10.8 10*3/uL (ref 4.0–11.0)

## 2016-03-01 LAB — PHOSPHORUS: Phosphorus: 3.7 mg/dL (ref 2.3–4.7)

## 2016-03-01 MED ORDER — ATORVASTATIN CALCIUM 40 MG PO TABS
40.0000 mg | ORAL_TABLET | Freq: Every evening | ORAL | Status: AC
Start: 2016-03-01 — End: ?
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
## 2016-03-01 NOTE — Progress Notes (Signed)
03/01/16 1245   Case Management Quick Doc   Case Management Assessment Status Assessment Complete   CM Comments 4/12 RNCM--Discharge today. Pt returning home with girlfriend support, declined home health. Indigent med form in shadow chart for new meds if needed, no controlled substances. RN made aware. No other CM interventions needed.    Expected Discharge Date 03/01/16   Physical Discharge Disposition Home, No Needs   Discharge Disposition   Mode of Transportation Car     Calla Kicks, RN, BSN  Case Manager  [phone]  [phone]: fax

## 2016-03-01 NOTE — Plan of Care (Signed)
Problem: Moderate/High Fall Risk Score >5  Goal: Patient will remain free of falls  Outcome: Progressing    Problem: Safety  Goal: Patient will be free from injury during hospitalization  Outcome: Progressing    Problem: Pain  Goal: Patient's pain/discomfort is manageable  Outcome: Progressing
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
## 2016-03-01 NOTE — Discharge Instructions (Signed)
Abrasions  Abrasions are skin scrapes. Their treatment depends on how large and deep the abrasion is.  Home care  You may be prescribed an antibiotic cream or ointment to apply to the wound. This helps prevent infection. Follow instructions when using this medication.  General care   To care for the abrasion, do the following each day for as long as directed by your health care provider.   If you were given a bandage, change it once a day. If your bandage sticks to the wound, soak it in warm water until it loosens.   Wash the area with soap and warm water. You may do this in a sink or under a tub faucet or shower. Rinse off the soap. Then pat the area dry with a clean towel.   If antibiotic ointment or cream was prescribed, reapply it to the wound as directed. Cover the wound with a fresh non-stick bandage. If the bandage becomes wet or dirty, change it as soon as possible.   You may use acetaminophen or ibuprofen to control pain unless another pain medication was prescribed. Note: If you have chronic liver or kidney disease or ever had a stomach ulcer or GI bleeding, talk with your health care provider before using these medications. Do not use ibuprofen in children under six months of age.   Most skin wounds heal within ten days. But an infection may occur despite treatment. Therefore, monitor the wound for signs of infection as listed below.  Follow-up care  Follow up with your health care provider, or as advised.  When to seek medical advice  Call your health care provider right away if any of these occur:   Fever of 101F (38.3C) or higher, or as directed by your health care provider   Increasing pain, redness, swelling, or drainage from the wound   Bleeding from the wound that does not stop after a few minutes of steady, firm pressure   Decreased ability to move any body part near wound  Date Last Reviewed: 02/08/2014   2000-2016 [employer], [place]  [address]. All rights reserved. This information is not intended as a substitute for professional medical care. Always follow your healthcare professional's instructions.          Bruises (Contusions)    A contusion is a bruise. A bruise happenswhen a blow to your body doesn't break the skin but does break blood vessels beneath the skin. Blood leaking from the broken vessels causes redness and swelling. As it heals, your bruise is likely to turn colors like purple, green, and yellow. This is normal. The bruise should fade in 2 or 3 weeks.  Factors that make you more likely to bruise  Almost everyone bruises now and then. Certain people do bruise more easily than others. You're more prone to bruising as you get older. That's because blood vessels become more fragile with age. You're also more likely to bruise if you have a clotting disorder such as hemophilia or take medications that reduce clotting, including aspirin.  When to go to the emergency room (ER)  Bruises almost always heal on their own without special treatment. But for some people, a bad bruise can be serious. Seek medical care if you:   Have a clotting disorder such as hemophilia.   Have cirrhosis or other serious liver disease.   Takeblood-thinning medications such as warfarin (Coumadin).  What to expect in the ER  A doctor will examine your  bruise and ask about any health conditions you have. In some cases, you may have a test to check how well your blood clots. Other treatment will depend on your needs.  Follow-up care  Sometimes a bruise gets worse instead of better. It may become larger and more swollen. This can occur when your body walls off a small pool of blood under the skin (hematoma). In very rare cases, your doctor may need to drain excess blood from the area.  Tip:  Apply an ice pack or bag of frozen peas to a bruise (keep a thin cloth between the cold source and your skin). This can help reduce redness and swelling.   Date Last Reviewed:  10/19/2013   2000-2016 [employer] [address], [place], [place] [address]. All rights reserved. This information is not intended as a substitute for professional medical care. Always follow your healthcare professional's instructions.

## 2016-03-01 NOTE — Progress Notes (Signed)
Pt IV was inserted by EMS and should have been changed after 24 hrs. Pt is to be discharged today with an event monitor. Pt does not want to be given a new IV since current IV is flushing. Informed pt of protocol and he is agreeable to new IV in the event he does not go home today the 12th.
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
## 2016-03-07 LAB — ECG 12-LEAD
P Wave Axis: 15 deg
P-R Interval: 155 ms
Patient Age: 44 years
Q-T Interval(Corrected): 406 ms
Q-T Interval: 381 ms
QRS Axis: -16 deg
QRS Duration: 111 ms
T Axis: 18 years
Ventricular Rate: 68 //min

## 2017-10-10 ENCOUNTER — Emergency Department (HOSPITAL_COMMUNITY): Payer: No Typology Code available for payment source

## 2017-10-10 ENCOUNTER — Encounter (HOSPITAL_COMMUNITY): Payer: Self-pay | Admitting: Emergency Medicine

## 2017-10-10 ENCOUNTER — Emergency Department (HOSPITAL_COMMUNITY)
Admission: EM | Admit: 2017-10-10 | Discharge: 2017-10-10 | Disposition: A | Discharge status: Home / Self Care | Payer: No Typology Code available for payment source | Attending: Emergency Medicine | Admitting: Emergency Medicine

## 2017-10-10 DIAGNOSIS — S161XXA Strain of muscle, fascia and tendon at neck level, initial encounter: Secondary | ICD-10-CM | POA: Insufficient documentation

## 2017-10-10 DIAGNOSIS — S20212A Contusion of left front wall of thorax, initial encounter: Secondary | ICD-10-CM | POA: Diagnosis not present

## 2017-10-10 DIAGNOSIS — K047 Periapical abscess without sinus: Secondary | ICD-10-CM | POA: Diagnosis not present

## 2017-10-10 DIAGNOSIS — Y999 Unspecified external cause status: Secondary | ICD-10-CM | POA: Insufficient documentation

## 2017-10-10 DIAGNOSIS — Y9389 Activity, other specified: Secondary | ICD-10-CM | POA: Diagnosis not present

## 2017-10-10 DIAGNOSIS — Y92410 Unspecified street and highway as the place of occurrence of the external cause: Secondary | ICD-10-CM | POA: Insufficient documentation

## 2017-10-10 DIAGNOSIS — S199XXA Unspecified injury of neck, initial encounter: Secondary | ICD-10-CM | POA: Diagnosis present

## 2017-10-10 MED ORDER — DICLOFENAC SODIUM 50 MG PO TBEC: 50.0000 mg | DELAYED_RELEASE_TABLET | Freq: Two times a day (BID) | ORAL | 0 refills | Status: DC

## 2017-10-10 MED ORDER — AMOXICILLIN 500 MG PO CAPS: 500.0000 mg | ORAL_CAPSULE | Freq: Three times a day (TID) | ORAL | 0 refills | Status: DC

## 2017-10-10 MED ORDER — CYCLOBENZAPRINE HCL 10 MG PO TABS: 10.0000 mg | ORAL_TABLET | Freq: Two times a day (BID) | ORAL | 0 refills | Status: DC | PRN
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
## 2017-10-10 NOTE — Discharge Instructions (Signed)
Do not drive while taking the muscle relaxant as it will make you sleepy. Follow up with your doctor or return here as needed for worsening symptoms.

## 2017-11-24 ENCOUNTER — Emergency Department (HOSPITAL_COMMUNITY): Payer: Self-pay

## 2017-11-24 ENCOUNTER — Encounter (HOSPITAL_COMMUNITY): Payer: Self-pay

## 2017-11-24 ENCOUNTER — Emergency Department (HOSPITAL_COMMUNITY)
Admission: EM | Admit: 2017-11-24 | Discharge: 2017-11-24 | Disposition: A | Discharge status: Home / Self Care | Payer: Self-pay | Attending: Emergency Medicine | Admitting: Emergency Medicine

## 2017-11-24 DIAGNOSIS — S39012A Strain of muscle, fascia and tendon of lower back, initial encounter: Secondary | ICD-10-CM | POA: Insufficient documentation

## 2017-11-24 DIAGNOSIS — W19XXXA Unspecified fall, initial encounter: Secondary | ICD-10-CM

## 2017-11-24 DIAGNOSIS — G8929 Other chronic pain: Secondary | ICD-10-CM | POA: Insufficient documentation

## 2017-11-24 DIAGNOSIS — Y9389 Activity, other specified: Secondary | ICD-10-CM | POA: Insufficient documentation

## 2017-11-24 DIAGNOSIS — R51 Headache: Secondary | ICD-10-CM | POA: Insufficient documentation

## 2017-11-24 DIAGNOSIS — W01118A Fall on same level from slipping, tripping and stumbling with subsequent striking against other sharp object, initial encounter: Secondary | ICD-10-CM | POA: Insufficient documentation

## 2017-11-24 DIAGNOSIS — Y92091 Bathroom in other non-institutional residence as the place of occurrence of the external cause: Secondary | ICD-10-CM | POA: Insufficient documentation

## 2017-11-24 DIAGNOSIS — Y998 Other external cause status: Secondary | ICD-10-CM | POA: Insufficient documentation

## 2017-11-24 DIAGNOSIS — R55 Syncope and collapse: Secondary | ICD-10-CM | POA: Insufficient documentation

## 2017-11-24 LAB — BASIC METABOLIC PANEL
Anion gap: 8 (ref 5–15)
BUN: 11 mg/dL (ref 6–20)
CO2: 31 mmol/L (ref 22–32)
CREATININE: 1.11 mg/dL (ref 0.61–1.24)
Calcium: 9.7 mg/dL (ref 8.9–10.3)
Chloride: 102 mmol/L (ref 101–111)
GFR calc Af Amer: >60 mL/min (ref >60)
GLUCOSE: 93 mg/dL (ref 65–99)
Potassium: 4.6 mmol/L (ref 3.5–5.1)
Sodium: 141 mmol/L (ref 135–145)

## 2017-11-24 LAB — CBC WITH DIFFERENTIAL/PLATELET
Basophils Absolute: 0 10*3/uL (ref 0.0–0.1)
Basophils Relative: 0 %
EOS PCT: 1 %
Eosinophils Absolute: 0.1 10*3/uL (ref 0.0–0.7)
HCT: 49.9 % (ref 39.0–52.0)
Hemoglobin: 16.7 g/dL (ref 13.0–17.0)
LYMPHS ABS: 2.5 10*3/uL (ref 0.7–4.0)
Lymphocytes Relative: 17 %
MCH: 27.9 pg (ref 26.0–34.0)
MCHC: 33.5 g/dL (ref 30.0–36.0)
MCV: 83.4 fL (ref 78.0–100.0)
MONO ABS: 0.9 10*3/uL (ref 0.1–1.0)
MONOS PCT: 6 %
Neutro Abs: 11.4 10*3/uL — ABNORMAL HIGH (ref 1.7–7.7)
Neutrophils Relative %: 76 %
PLATELETS: 321 10*3/uL (ref 150–400)
RBC: 5.98 MIL/uL — ABNORMAL HIGH (ref 4.22–5.81)
RDW: 13.3 % (ref 11.5–15.5)
WBC: 15 10*3/uL — AB (ref 4.0–10.5)

## 2017-11-24 LAB — I-STAT TROPONIN, ED: Troponin i, poc: 0.01 ng/mL (ref 0.00–0.08)

## 2017-11-24 MED ORDER — HYDROMORPHONE HCL 1 MG/ML IJ SOLN
1.0000 mg | Freq: Once | INTRAMUSCULAR | Status: AC
  Administered 2017-11-24: 1 mg via INTRAVENOUS
  Filled 2017-11-24: qty 1

## 2017-11-24 MED ORDER — IBUPROFEN 800 MG PO TABS: 800.0000 mg | ORAL_TABLET | Freq: Three times a day (TID) | ORAL | 0 refills | Status: AC

## 2017-11-24 MED ORDER — KETOROLAC TROMETHAMINE 30 MG/ML IJ SOLN
30.0000 mg | Freq: Once | INTRAMUSCULAR | Status: AC
  Administered 2017-11-24: 30 mg via INTRAVENOUS
  Filled 2017-11-24: qty 1

## 2017-11-24 MED ORDER — DIAZEPAM 5 MG PO TABS: 5.0000 mg | ORAL_TABLET | Freq: Three times a day (TID) | ORAL | 0 refills | Status: AC | PRN

## 2017-11-24 MED ORDER — OXYCODONE-ACETAMINOPHEN 5-325 MG PO TABS: 1.0000 | ORAL_TABLET | ORAL | 0 refills | Status: AC | PRN

## 2017-11-24 MED ORDER — SODIUM CHLORIDE 0.9 % IV BOLUS (SEPSIS)
1000.0000 mL | Freq: Once | INTRAVENOUS | Status: AC
  Administered 2017-11-24: 1000 mL via INTRAVENOUS

## 2017-11-24 MED ORDER — PROCHLORPERAZINE EDISYLATE 5 MG/ML IJ SOLN
10.0000 mg | Freq: Once | INTRAMUSCULAR | Status: AC
  Administered 2017-11-24: 10 mg via INTRAVENOUS
  Filled 2017-11-24: qty 2

## 2017-11-24 NOTE — ED Provider Notes (Signed)
[facility] EMERGENCY DEPARTMENT Provider Note   CSN: [id number] Arrival date & time: 11/24/17  0151     History   Chief Complaint Chief Complaint  Patient presents with  . Loss of Consciousness  . Migraine    HPI Eric Estrada is a 47 y.o. male.  Patient presents to the emergency department for evaluation of low back pain after a syncopal episode.  Patient reports that he was going to the bathroom, and that the last thing he remembers.  His son found him on the floor.  He has a history of chronic back pain, but since the syncopal episode he has been having severe low back pain.  Reports a sharp stabbing pain that worsens with any movement.  Pain does not radiate to the legs, no numbness, tingling or weakness in lower extremities.  He has a mild headache, this has been ongoing with him.  He has chronic headaches and back pain secondary to some type of injury in the past.      History reviewed. No pertinent past medical history.  There are no active problems to display for this patient.   Past Surgical History:  Procedure Laterality Date  . FRACTURE SURGERY         Home Medications    Prior to Admission medications   Medication Sig Start Date End Date Taking? Authorizing Provider  diazepam (VALIUM) 5 MG tablet Take 1 tablet (5 mg total) by mouth every 8 (eight) hours as needed for muscle spasms. 11/24/17   Gilda Crease, MD  ibuprofen (ADVIL,MOTRIN) 800 MG tablet Take 1 tablet (800 mg total) by mouth 3 (three) times daily. 11/24/17   Gilda Crease, MD  oxyCODONE-acetaminophen (PERCOCET) 5-325 MG tablet Take 1-2 tablets by mouth every 4 (four) hours as needed. 11/24/17   Gilda Crease, MD    Family History History reviewed. No pertinent family history.  Social History Social History   Tobacco Use  . Smoking status: Never Smoker  . Smokeless tobacco: Never Used  Substance Use Topics  . Alcohol use: Not on file  . Drug  use: Not on file     Allergies   Patient has no known allergies.   Review of Systems Review of Systems  Musculoskeletal: Positive for back pain. Negative for neck pain.  Neurological: Positive for syncope and headaches.  All other systems reviewed and are negative.    Physical Exam Updated Vital Signs BP (!) 104/57   Pulse 68   Temp 97.7 F (36.5 C) (Oral)   Resp 13   Ht 6\' 2"  (1.88 m)   Wt 122.5 kg (270 lb)   SpO2 94%   BMI 34.67 kg/m   Physical Exam  Constitutional: He is oriented to person, place, and time. He appears well-developed and well-nourished. No distress.  HENT:  Head: Normocephalic and atraumatic.  Right Ear: Hearing normal.  Left Ear: Hearing normal.  Nose: Nose normal.  Mouth/Throat: Oropharynx is clear and moist and mucous membranes are normal.  Eyes: Conjunctivae and EOM are normal. Pupils are equal, round, and reactive to light.  Neck: Normal range of motion. Neck supple.  Cardiovascular: Regular rhythm, S1 normal and S2 normal. Exam reveals no gallop and no friction rub.  No murmur heard. Pulmonary/Chest: Effort normal and breath sounds normal. No respiratory distress. He exhibits no tenderness.  Abdominal: Soft. Normal appearance and bowel sounds are normal. There is no hepatosplenomegaly. There is no tenderness. There is no rebound, no guarding, no  tenderness at McBurney's point and negative Murphy's sign. No hernia.  Musculoskeletal: Normal range of motion.       Lumbar back: He exhibits tenderness.       Back:  Neurological: He is alert and oriented to person, place, and time. He has normal strength. No cranial nerve deficit or sensory deficit. Coordination normal. GCS eye subscore is 4. GCS verbal subscore is 5. GCS motor subscore is 6.  Skin: Skin is warm, dry and intact. No rash noted. No cyanosis.  Psychiatric: He has a normal mood and affect. His speech is normal and behavior is normal. Thought content normal.  Nursing note and vitals  reviewed.    ED Treatments / Results  Labs (all labs ordered are listed, but only abnormal results are displayed) Labs Reviewed  CBC WITH DIFFERENTIAL/PLATELET - Abnormal; Notable for the following components:      Result Value   WBC 15.0 (*)    RBC 5.98 (*)    Neutro Abs 11.4 (*)    All other components within normal limits  BASIC METABOLIC PANEL  I-STAT TROPONIN, ED    EKG  EKG Interpretation None       Radiology Dg Lumbar Spine Complete  Result Date: 11/24/2017 CLINICAL DATA:  Initial evaluation for acute trauma, fall. EXAM: LUMBAR SPINE - COMPLETE 4+ VIEW COMPARISON:  None. FINDINGS: Five non rib-bearing lumbar type vertebral bodies are present. Vertebral bodies normally aligned with preservation of the normal lumbar lordosis. No listhesis. Vertebral body heights maintained. No fracture. Partially visualized sacrum intact. Mild multilevel degenerative endplate spurring seen throughout the lumbar spine, most notable at the inferior plate of L2 and superior endplate of L4. No soft tissue abnormality. IMPRESSION: 1. No radiographic evidence for acute traumatic injury within the lumbar spine. 2. Mild multilevel degenerative spondylolysis. Electronically Signed   By: Rise MuBenjamin  McClintock M.D.   On: 11/24/2017 03:14   Ct Head Wo Contrast  Result Date: 11/24/2017 CLINICAL DATA:  Initial evaluation for acute posttraumatic headache. EXAM: CT HEAD WITHOUT CONTRAST TECHNIQUE: Contiguous axial images were obtained from the base of the skull through the vertex without intravenous contrast. COMPARISON:  None. FINDINGS: Brain: Cerebral volume within normal limits for patient age. No evidence for acute intracranial hemorrhage. No findings to suggest acute large vessel territory infarct. No mass lesion, midline shift, or mass effect. Ventricles are normal in size without evidence for hydrocephalus. No extra-axial fluid collection identified. Vascular: No hyperdense vessel identified. Skull: Scalp  soft tissues demonstrate no acute abnormality.Calvarium intact. Sinuses/Orbits: Globes and orbital soft tissues are within normal limits. Mild scattered mucoperiosteal thickening within the ethmoidal air cells and maxillary sinuses. No mastoid effusion. IMPRESSION: Normal head CT.  No acute intracranial abnormality. Electronically Signed   By: Rise MuBenjamin  McClintock M.D.   On: 11/24/2017 03:11    Procedures Procedures (including critical care time)  Medications Ordered in ED Medications  sodium chloride 0.9 % bolus 1,000 mL (1,000 mLs Intravenous New Bag/Given 11/24/17 0237)  ketorolac (TORADOL) 30 MG/ML injection 30 mg (30 mg Intravenous Given 11/24/17 0238)  prochlorperazine (COMPAZINE) injection 10 mg (10 mg Intravenous Given 11/24/17 0237)  HYDROmorphone (DILAUDID) injection 1 mg (1 mg Intravenous Given 11/24/17 0242)     Initial Impression / Assessment and Plan / ED Course  I have reviewed the triage vital signs and the nursing notes.  Pertinent labs & imaging results that were available during my care of the patient were reviewed by me and considered in my medical decision making (see chart for  details).     Patient presents to the emergency department for evaluation of back pain after syncopal episode.  Patient reports that he has had intermittent episodes of syncope for years.  He has never figured out what causes this.  He passed out in the bathroom and fell up against the toilet, landing on his back.  He has a history of chronic back pain as well.  Patient complaining of severe lower back pain that worsens with any movement.  Examination reveals point tenderness in the paraspinal muscles with spasm and worsening with movement.  Lumbar x-ray was negative.  He has normal neurologic function.  CT head does not show acute abnormality.  Remainder of workup was unremarkable.  Patient treated with analgesia, will be discharged with analgesia, rest.  [facility] Syncope Rule from [email or web address]  on  11/24/2017 ** All calculations should be rechecked by clinician prior to use **  RESULT SUMMARY:     Patient IS in the low-risk group for serious outcome.   INPUTS: Congestive heart failure history -> 0 = No Hematocrit <30% -> 0 = No EKG abnormal (EKG changed, or any non-sinus rhythm on EKG or monitoring) -> 0 = No Shortness of breath symptoms -> 0 = No Systolic BP <90 mmHg at triage -> 0 = No   Final Clinical Impressions(s) / ED Diagnoses   Final diagnoses:  Fall  Syncope, unspecified syncope type  Lumbar strain, initial encounter    ED Discharge Orders        Ordered    oxyCODONE-acetaminophen (PERCOCET) 5-325 MG tablet  Every 4 hours PRN     11/24/17 0753    ibuprofen (ADVIL,MOTRIN) 800 MG tablet  3 times daily     11/24/17 0753    diazepam (VALIUM) 5 MG tablet  Every 8 hours PRN     11/24/17 0753       Gilda Crease, MD 11/24/17 [phone]
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
## 2017-11-24 NOTE — ED Notes (Signed)
Pt given water per Dr.Pollina

## 2019-02-12 IMAGING — DX DG LUMBAR SPINE COMPLETE 4+V
5 series · 5 of 5 positions shown · non-contrast
Comparison: None.

CLINICAL DATA: Initial evaluation for acute trauma, fall.

EXAM:
LUMBAR SPINE - COMPLETE 4+ VIEW

[l-spine ap]
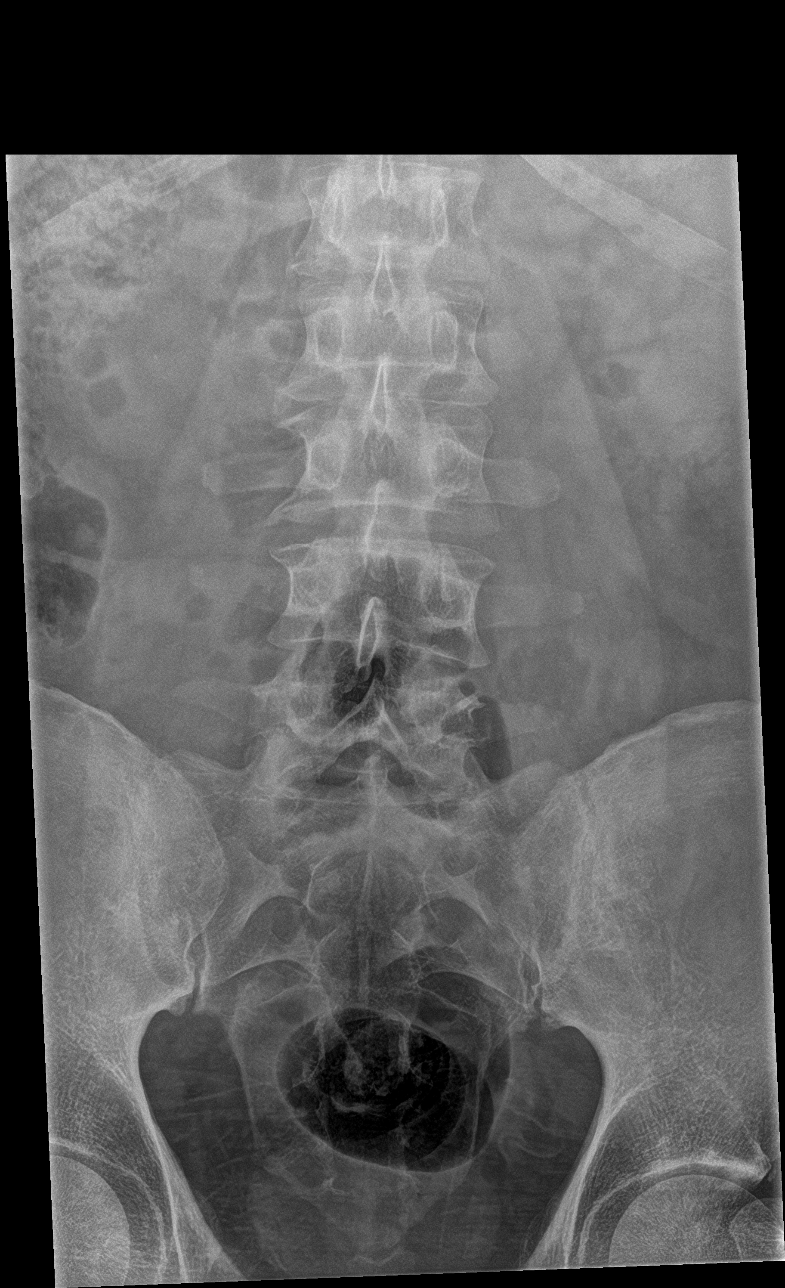

[l-spine obl (1 of 2)]
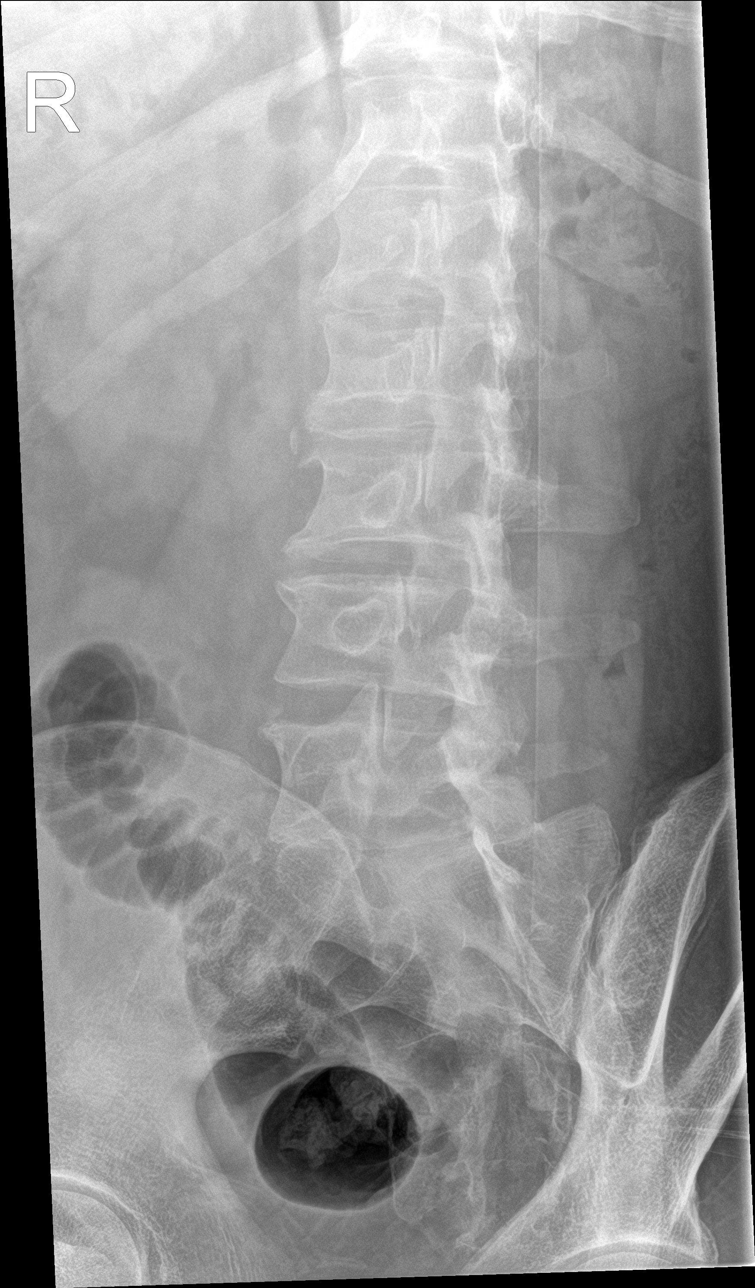

[l-spine obl (2 of 2)]
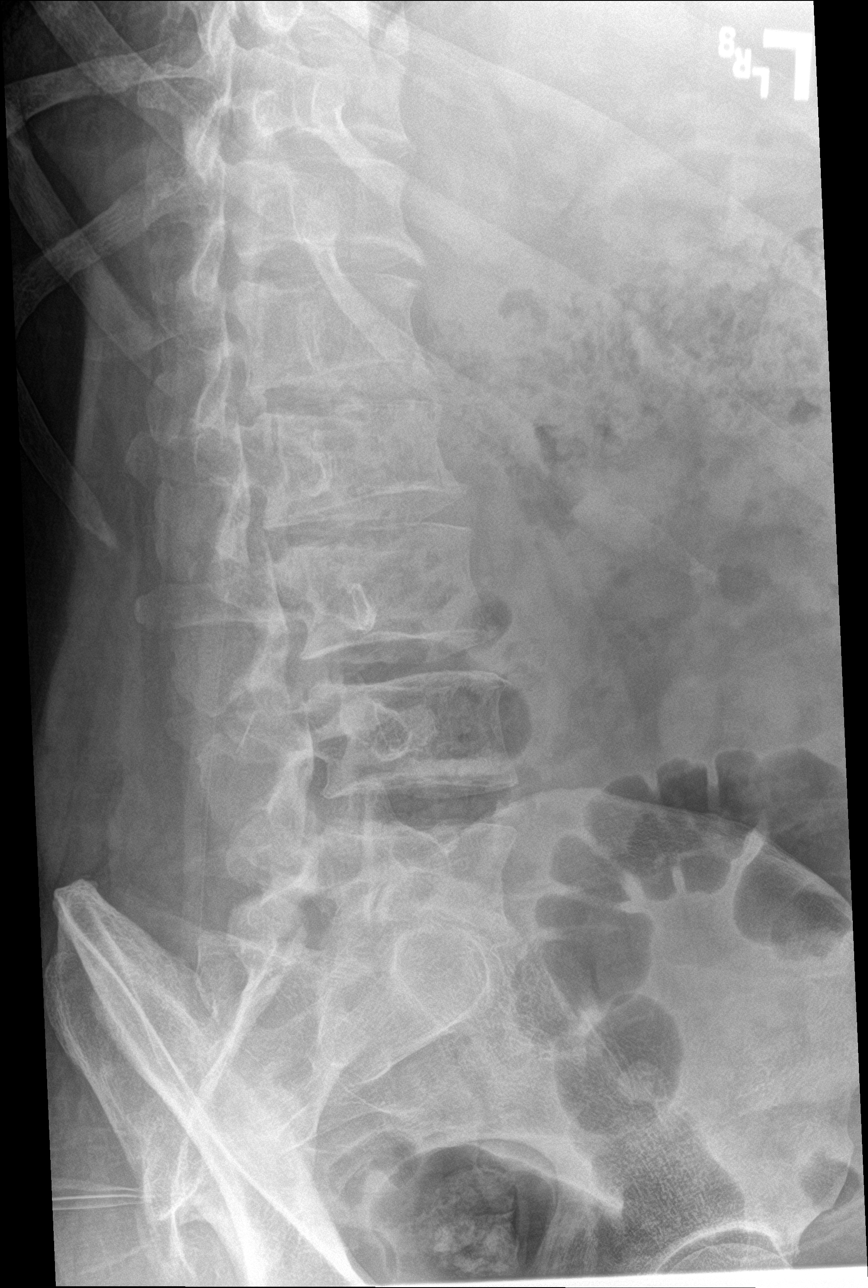

[l-spine lat]
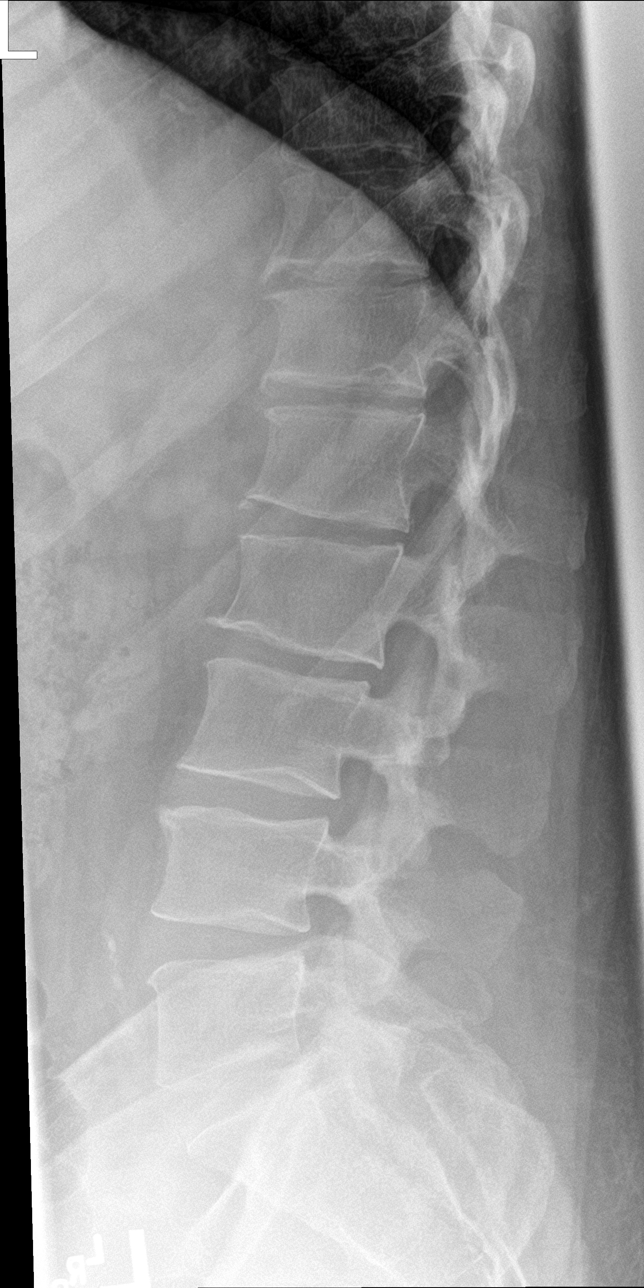

[l-spine spot]
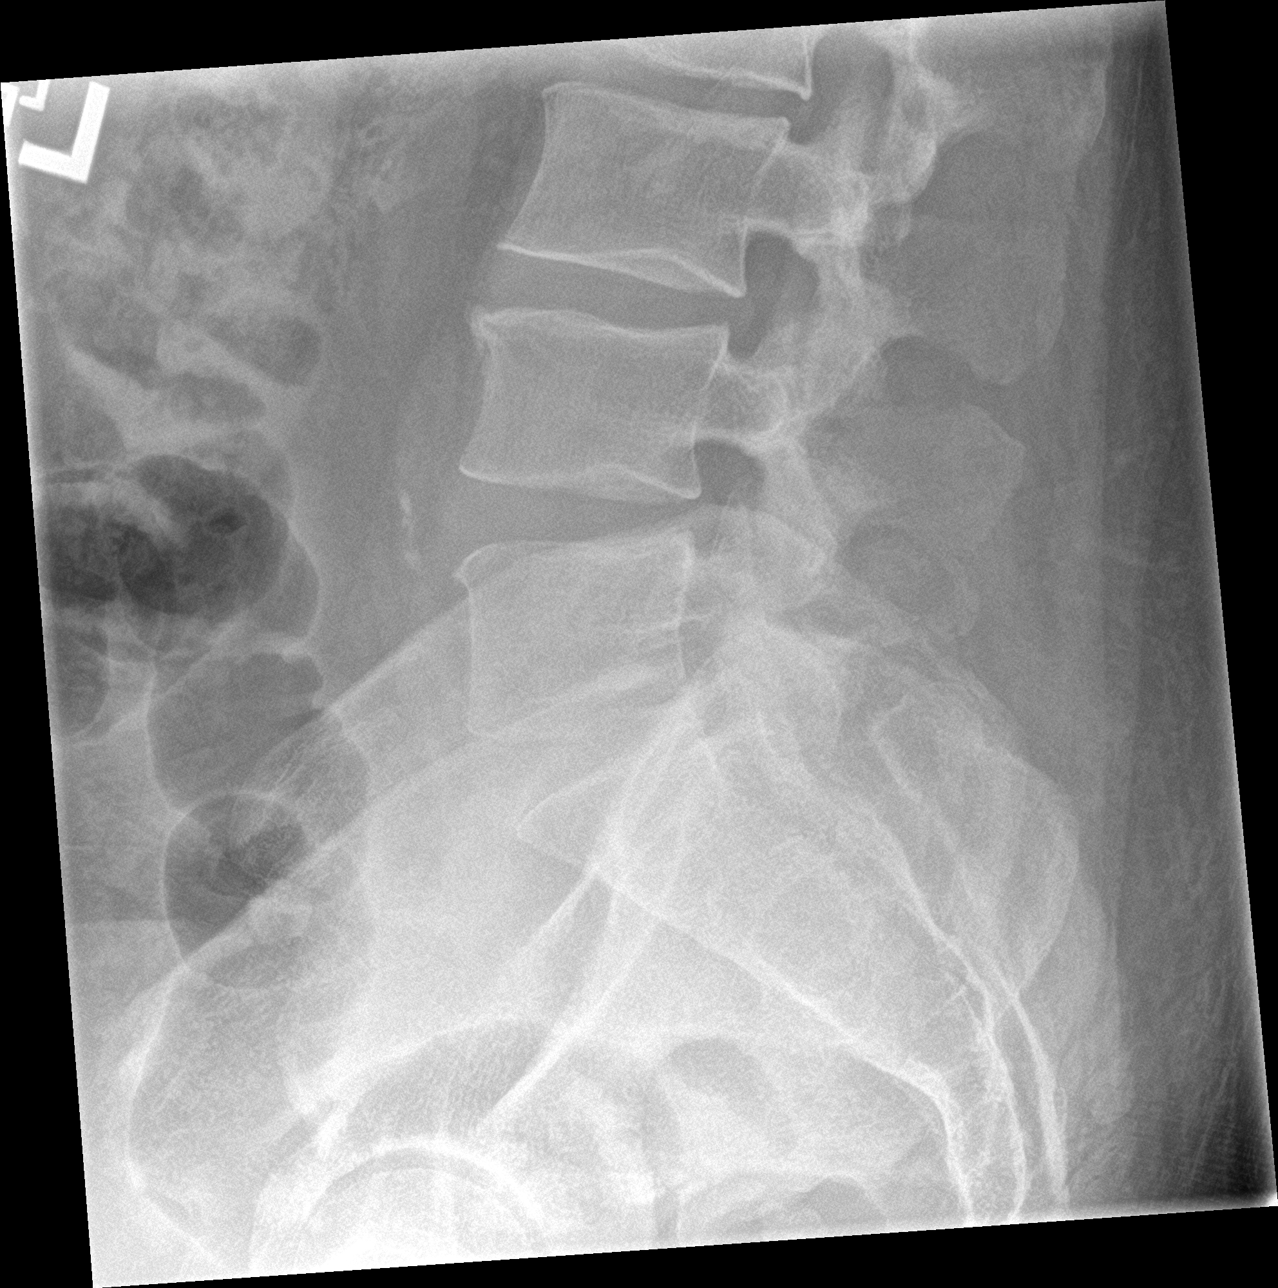

[5 of 5 positions shown; findings below may reference images not displayed]

FINDINGS: Five non rib-bearing lumbar type vertebral bodies are present.
Vertebral bodies normally aligned with preservation of the normal
lumbar lordosis. No listhesis. Vertebral body heights maintained. No
fracture. Partially visualized sacrum intact.

Mild multilevel degenerative endplate spurring seen throughout the
lumbar spine, most notable at the inferior plate of L2 and superior
endplate of L4.

No soft tissue abnormality.
IMPRESSION: 1. No radiographic evidence for acute traumatic injury within the
lumbar spine.
2. Mild multilevel degenerative spondylolysis.

## 2019-02-12 IMAGING — CT CT HEAD W/O CM
3 of 4 series · 14 of 47 positions shown, 16 images · non-contrast
Comparison: None.

CLINICAL DATA: Initial evaluation for acute posttraumatic headache.

EXAM:
CT HEAD WITHOUT CONTRAST
TECHNIQUE: Contiguous axial images were obtained from the base of the skull
through the vertex without intravenous contrast.

[Series 4: head 2.0 h70h · axial · 0.44mm/px · z∈[-82,+58]mm · 8 of 88 slices shown, 10 images]
[im 9/88  brain]
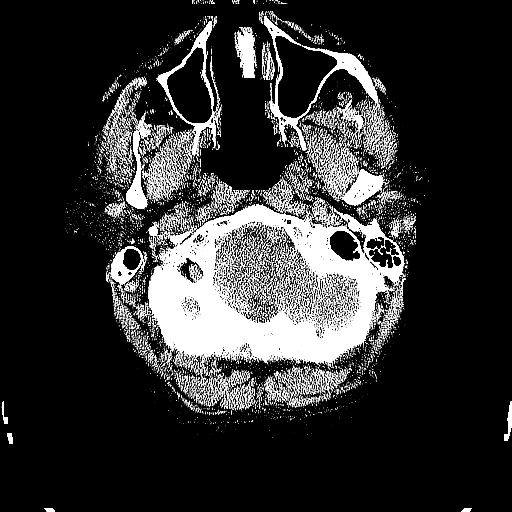
[im 9/88  bone]
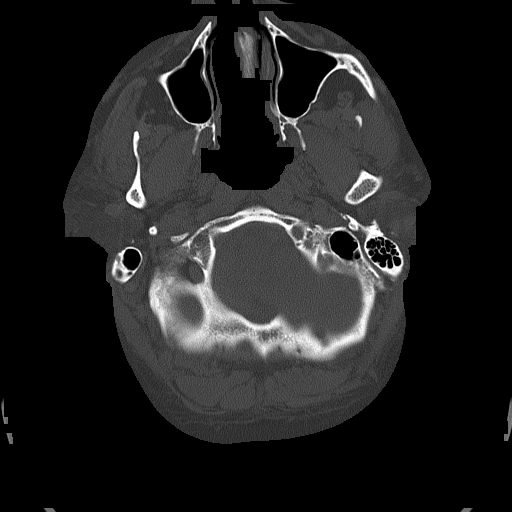
[im 18/88  brain]
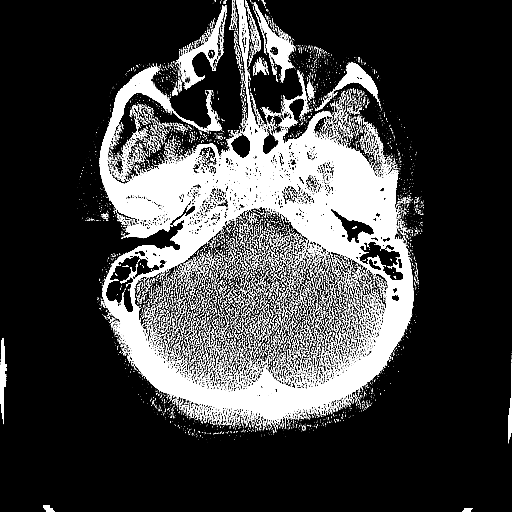
[im 27/88  brain]
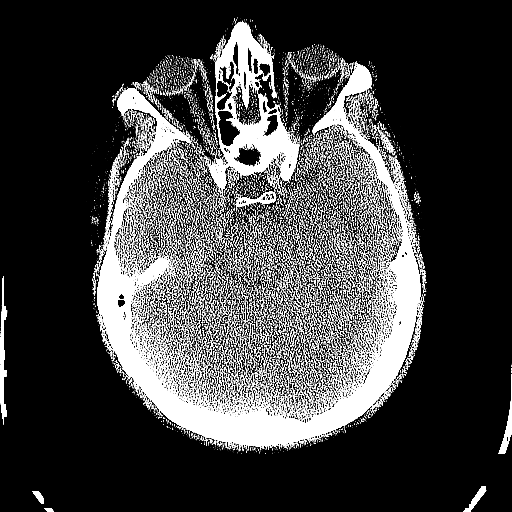
[im 40/88  brain]
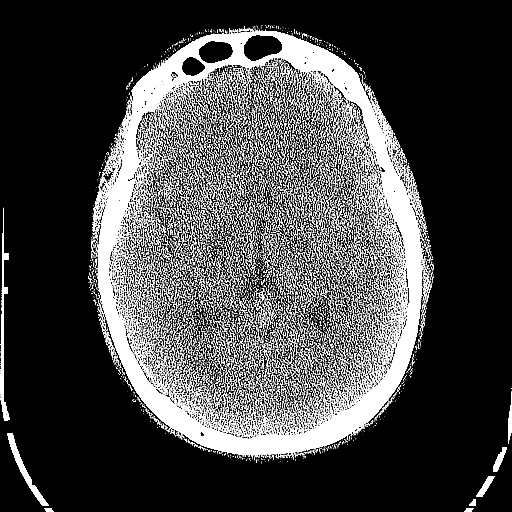
[im 48/88  brain]
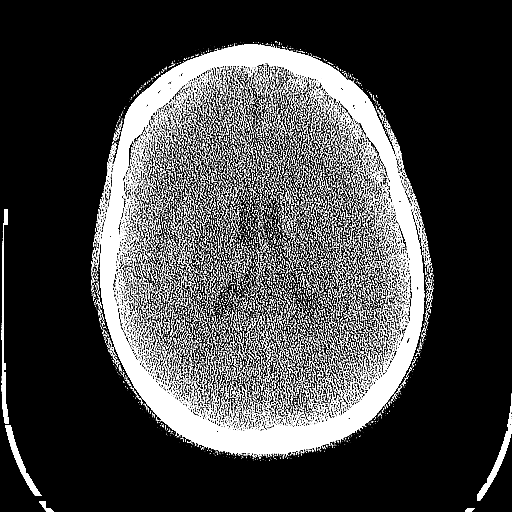
[im 48/88  bone]
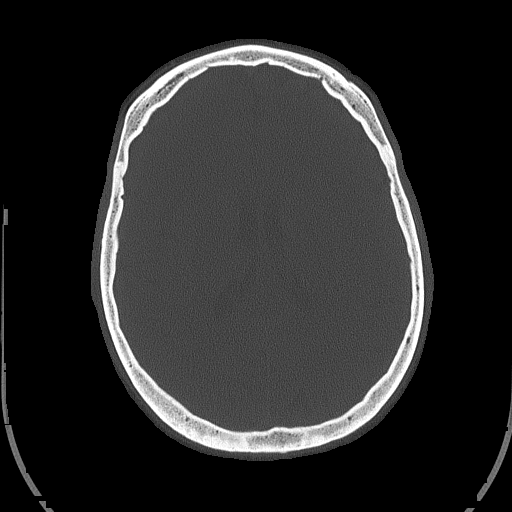
[im 61/88  brain]
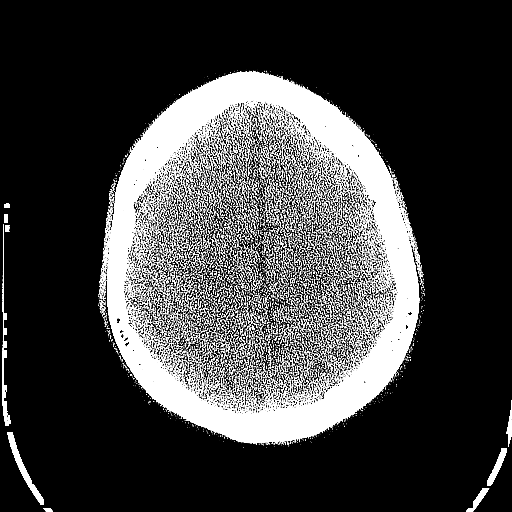
[im 70/88  brain]
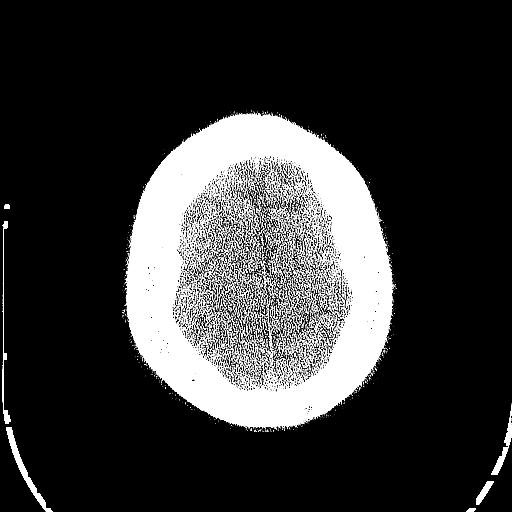
[im 79/88  brain]
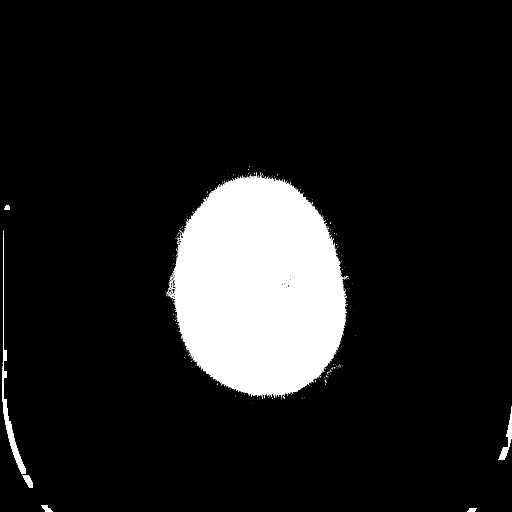

[Series 5: head 3.0 mpr cor · coronal · 0.34mm/px · 3 of 67 slices shown]
[im 23/67  brain]
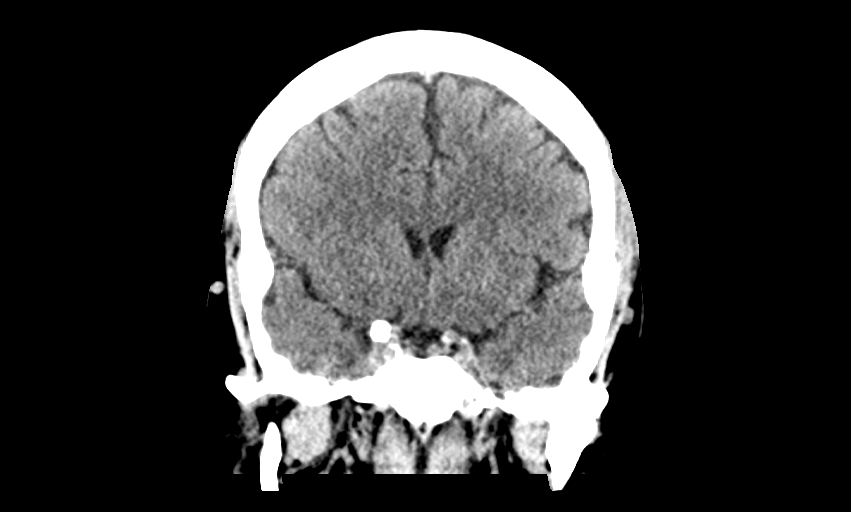
[im 30/67  brain]
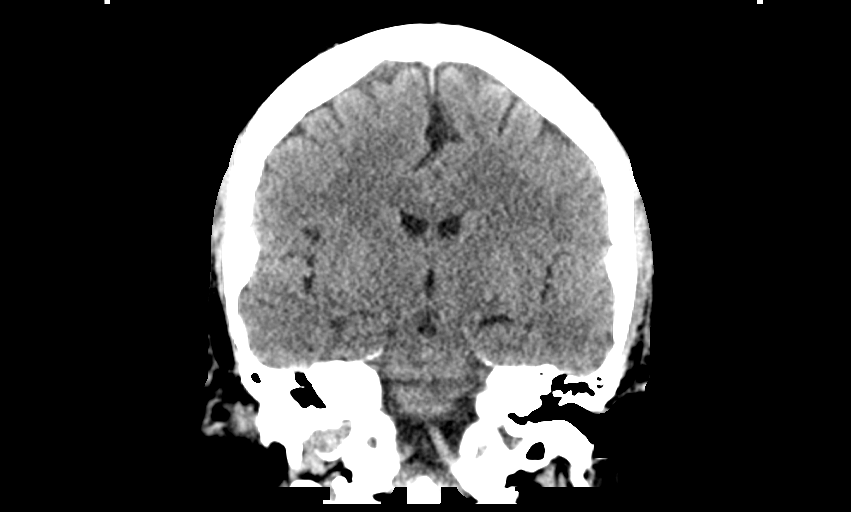
[im 37/67  brain]
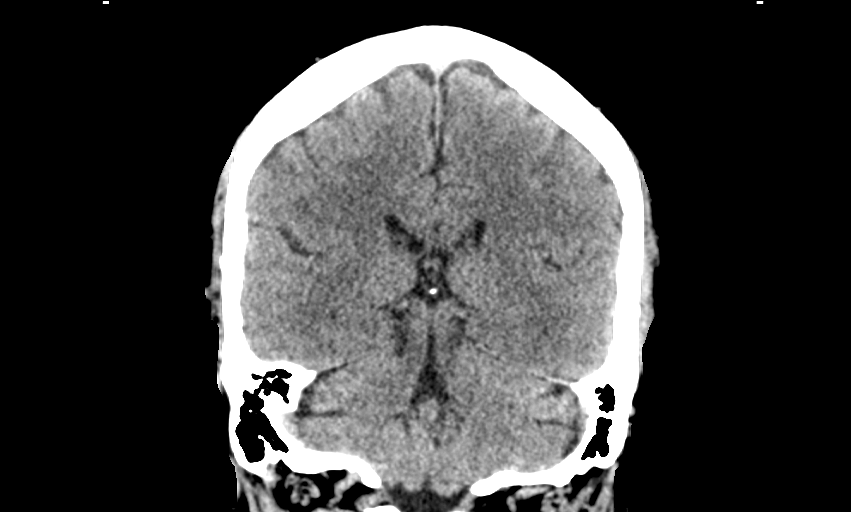

[Series 6: head 3.0 mpr sag · sagittal · 0.34mm/px · 3 of 67 slices shown]
[im 23/67  brain]
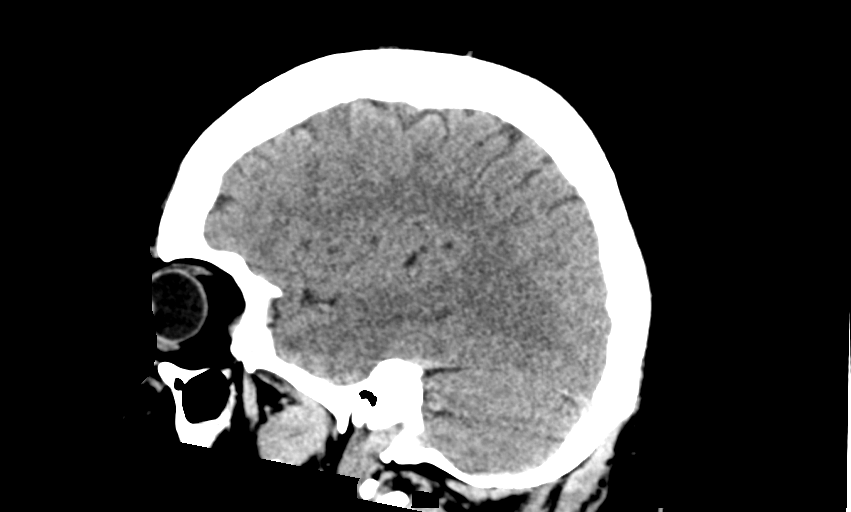
[im 34/67  brain]
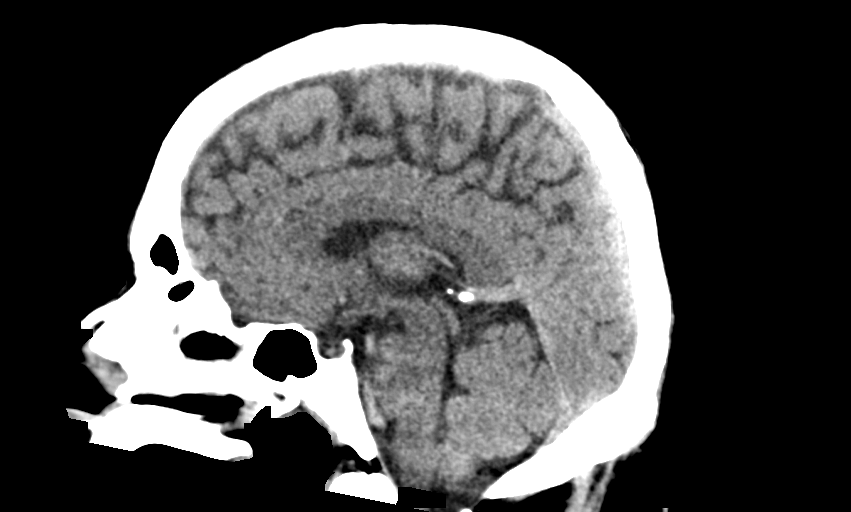
[im 45/67  brain]
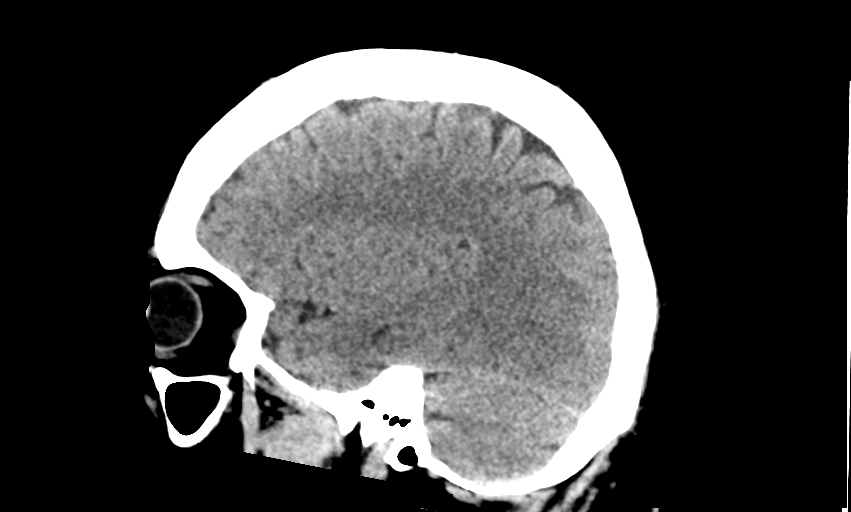

[14 of 47 positions shown; findings below may reference images not displayed]

FINDINGS: Brain: Cerebral volume within normal limits for patient age.

No evidence for acute intracranial hemorrhage. No findings to
suggest acute large vessel territory infarct. No mass lesion,
midline shift, or mass effect. Ventricles are normal in size without
evidence for hydrocephalus. No extra-axial fluid collection
identified.

Vascular: No hyperdense vessel identified.

Skull: Scalp soft tissues demonstrate no acute abnormality.Calvarium
intact.

Sinuses/Orbits: Globes and orbital soft tissues are within normal
limits.

Mild scattered mucoperiosteal thickening within the ethmoidal air
cells and maxillary sinuses. No mastoid effusion.
IMPRESSION: Normal head CT.  No acute intracranial abnormality.

## 2019-08-18 ENCOUNTER — Encounter

## 2019-08-18 ENCOUNTER — Inpatient Hospital Stay: Admit: 2019-08-18 | Primary: Student in an Organized Health Care Education/Training Program

## 2019-08-18 DIAGNOSIS — M5134 Other intervertebral disc degeneration, thoracic region: Secondary | ICD-10-CM

## 2020-06-11 ENCOUNTER — Encounter

## 2020-06-11 ENCOUNTER — Inpatient Hospital Stay: Admit: 2020-06-11 | Primary: Student in an Organized Health Care Education/Training Program

## 2020-06-11 DIAGNOSIS — M47812 Spondylosis without myelopathy or radiculopathy, cervical region: Secondary | ICD-10-CM

## 2021-11-23 ENCOUNTER — Ambulatory Visit: Admit: 2021-11-23 | Discharge: 2021-11-23 | Attending: Family Medicine | Primary: Family Medicine

## 2021-11-23 NOTE — Progress Notes (Signed)
[facility]    History of Present Illness:   Michael Gonzales is a 51 y.o. male here for   Chief Complaint   Patient presents with    Establish Care         HPI:  Here to establish care.   H/o kidney stones, went to ER in 11/22.  Labs done there showed slightly elevated white count, glucose 107, low calcium 8.4.  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 Minimal exercise.  C/o L [facility] pain, [facility], [facility] this [facility].  Seeing [facility].   Had colonoscopy done at the [place], reportedly normal repeat at age 23.    He complains of a 40 pound weight gain in the past year.    No h/o HTN.   Both mom and sister had ovarian cancer, both deceased.  Brother had prostate and lung cancer and died at age 48 of sepsis likely related to chemo.        Past Medical, Family, and Social History:     Past Medical History:   Diagnosis Date    Calculus of kidney     GERD (gastroesophageal reflux disease)     Headache       Past Surgical History:   Procedure Laterality Date    HX ACL RECONSTRUCTION Left     HX ORTHOPAEDIC         Current Outpatient Medications on File Prior to Visit   Medication Sig Dispense Refill    omeprazole (PRILOSEC) 40 mg capsule Take 40 mg by mouth Daily (before breakfast).      tamsulosin (FLOMAX) 0.4 mg capsule Take 0.4 mg by mouth daily. As needed for kidney stone      ondansetron (ZOFRAN ODT) 4 mg disintegrating tablet DISSOLVE 1 TABLET IN MOUTH EVERY 6 HOURS AS NEEDED FOR VOMITING OR NAUSEA FOR UP TO 7 DAYS      tiZANidine (ZANAFLEX) 4 mg tablet Take 4 mg by mouth as needed.      eletriptan (RELPAX) 40 mg tablet Take 40 mg by mouth once as needed.       No current facility-administered medications on file prior to visit.       Patient Active Problem List   Diagnosis Code    Migraine without aura and with status migrainosus, not intractable G43.001       Social History     Socioeconomic History    Marital status: SINGLE   Tobacco Use    Smoking status: Never     Passive  exposure: Never    Smokeless tobacco: Never   Substance and Sexual Activity    Alcohol use: Never    Drug use: Never        Review of Systems   Review of Systems   Constitutional:  Negative for chills and fever.   Respiratory:  Negative for cough and shortness of breath.    Cardiovascular:  Negative for chest pain.   Gastrointestinal:  Negative for abdominal pain and blood in stool.   Genitourinary:  Negative for hematuria.     Objective:   Visit Vitals  BP 125/85 (BP 1 Location: Left upper arm, BP Patient Position: Sitting, BP Cuff Size: Large adult)   Pulse 66   Temp 97.1 ??F (36.2 ??C) (Oral)   Resp 18   Ht 6\' 2"  (1.88 m)   Wt 294 lb (133.4 kg)   SpO2 97%   BMI 37.75 kg/m??  Physical Exam  PHYSICAL EXAM:  Gen: Pt sitting in chair, in NAD, morbidly obese  Head: Normocephalic, atraumatic  Eyes: Sclera anicteric, EOM grossly intact,  Ears: TM's pearly with good light reflex b/l  Neck: Supple, no LAD, no thyromegaly or carotid bruits  CVS: Normal S1, S2, no m/r/g  Resp: CTAB, no wheezes or rales  Abd: Soft, non-tender, non-distended, +BS  Extrem: Atraumatic, no cyanosis or edema  Pulses: 2+   Skin: Warm, dry  Neuro: Alert, oriented, appropriate    Pertinent Labs/Studies:  3 most recent PHQ Screens 11/23/2021   Little interest or pleasure in doing things Not at all   Feeling down, depressed, irritable, or hopeless Not at all   Total Score PHQ 2 0   Trouble falling or staying asleep, or sleeping too much Not at all   Feeling tired or having little energy Not at all   Poor appetite, weight loss, or overeating Not at all   Feeling bad about yourself - or that you are a failure or have let yourself or your family down Not at all   Trouble concentrating on things such as school, work, reading, or watching TV Not at all   Moving or speaking so slowly that other people could have noticed; or the opposite being so fidgety that others notice Not at all   Thoughts of being better off dead, or hurting yourself in some way Not  at all   PHQ 9 Score 0   How difficult have these problems made it for you to do your work, take care of your home and get along with others Not difficult at all          Assessment and orders:       ICD-10-CM ICD-9-CM    1. Elevated blood sugar  R73.9 790.29 HEMOGLOBIN A1C WITH EAG      2. Encounter for long-term (current) use of other medications  Z79.899 V58.69 CBC WITH AUTOMATED DIFF      METABOLIC PANEL, COMPREHENSIVE      3. Weight gain  R63.5 783.1 TSH 3RD GENERATION      4. Mixed hyperlipidemia  E78.2 272.2 LIPID PANEL        Diagnoses and all orders for this visit:    1. Elevated blood sugar  -     HEMOGLOBIN A1C WITH EAG; Future    2. Encounter for long-term (current) use of other medications  -     CBC WITH AUTOMATED DIFF; Future  -     METABOLIC PANEL, COMPREHENSIVE; Future    3. Weight gain  -     TSH 3RD GENERATION; Future    4. Mixed hyperlipidemia  -     LIPID PANEL; Future    Follow-up and Dispositions    Return in about 26 weeks (around 05/24/2022).       current treatment plan is effective, no change in therapy  lab results and schedule of future lab studies reviewed with patient  reviewed diet, exercise and weight control    Patient Instructions  I advised reduction of dietary salt intake, DASH diet, and exercise daily.  Your labs show a slightly elevated glucose which is a risk factor for diabetes.  Limit carbohydrates such as carbonated beverages, sweet tea, and sweets.    Exercise daily if able.  He signed for medical records today.  I have discussed the diagnosis with the patient and the intended plan as seen in the above orders.  Social history, medical history,  and labs were reviewed.  The patient has received an after-visit summary and questions were answered concerning future plans.  I have discussed medication side effects and warnings with the patient as well. Patient/guardian verbalized understanding and accepts plan & risks.   Please note that this dictation was completed with Dragon,  the computer voice recognition software.  Quite often unanticipated grammatical, syntax, homophones, and other interpretive errors are inadvertently transcribed by the computer software.  Please disregard these errors.  Please excuse any errors that have escaped final proofreading.  Thank you.     Phylliss Blakes, MD  [facility]  11/23/21

## 2021-11-23 NOTE — Progress Notes (Signed)
 1. Have you been to the ER, urgent care clinic since your last visit?  Hospitalized since your last visit? Yes When: [facility] for kidney stone     2. Have you seen or consulted any other health care providers outside of the [facility] since your last visit? Yes When: [facility]rology       3. For patients aged 49-75: Has the patient had a colonoscopy / FIT/ Cologuard? Yes - Care Gap present. Rooming MA/LPN to request most recent results  [facility]     If the patient is male:    4. For patients aged 69-74: Has the patient had a mammogram within the past 2 years? NA - based on age or sex      55. For patients aged 21-65: Has the patient had a pap smear? NA - based on age or sex    Health Maintenance Due   Topic Date Due    Hepatitis C Screening  Never done    Depression Screen  Never done    COVID-19 Vaccine (1) Never done    DTaP/Tdap/Td series (1 - Tdap) Never done    Lipid Screen  Never done    Colorectal Cancer Screening Combo  Never done    Flu Vaccine (1) Never done

## 2022-02-03 NOTE — Telephone Encounter (Signed)
 Pt needs a referral to Ortho for knee replacement consultation. [facility] [place] [facility] [place]. Please advise.

## 2022-02-08 ENCOUNTER — Ambulatory Visit: Admit: 2022-02-08 | Discharge: 2022-02-08 | Attending: Family Medicine | Primary: Family Medicine

## 2022-02-08 DIAGNOSIS — G8929 Other chronic pain: Secondary | ICD-10-CM

## 2022-02-08 MED ORDER — TIZANIDINE 4 MG TAB
4 mg | ORAL_TABLET | Freq: Three times a day (TID) | ORAL | 0 refills | Status: AC | PRN
Start: 2022-02-08 — End: ?

## 2022-02-08 NOTE — Progress Notes (Signed)
 Chief Complaint   Patient presents with    Knee Pain     Requests ortho consult for knee replacement.     1. Have you been to the ER, urgent care clinic since your last visit?  Hospitalized since your last visit? No    2. Have you seen or consulted any other health care providers outside of the [facility] since your last visit? No     3. For patients aged 49-75: Has the patient had a colonoscopy / FIT/ Cologuard? Yes - Care Gap present. Rooming MA/LPN to request most recent results      If the patient is male:    4. For patients aged 57-74: Has the patient had a mammogram within the past 2 years? NA - based on age or sex      42. For patients aged 21-65: Has the patient had a pap smear? NA - based on age or sex    Health Maintenance Due   Topic Date Due    Hepatitis C Screening  Never done    COVID-19 Vaccine (1) Never done    DTaP/Tdap/Td series (1 - Tdap) Never done    Lipid Screen  Never done    Colorectal Cancer Screening Combo  Never done    Flu Vaccine (1) Never done

## 2022-02-09 NOTE — Telephone Encounter (Signed)
-----   Message from Bernadene Earnie Che sent at 02/09/2022 11:13 AM EDT -----  Subject: Message to Provider    QUESTIONS  Information for Provider? Patient would like to speak with nurse/staff to   please contact him to discuss pharmacy/list information for medication.   Please contact patient to assist.   ---------------------------------------------------------------------------  --------------  GWENITH SANES INFO  [phone]; OK to leave message on voicemail  ---------------------------------------------------------------------------  --------------  SCRIPT ANSWERS  Relationship to Patient? Self

## 2022-02-09 NOTE — Telephone Encounter (Signed)
 Patient called, stated he would like to know which pharmacies are listed under community care program through [place]. Notified to contact them.. Stated Michael Gonzales is not within the program. Notified patient out of pocket cost is approximately 15$, which he states is very affordable. Notified patient as well that he can transfer this rx to [facility] and use goodrx which will decrease the price to less than 5$. Verbalizes understanding.

## 2022-04-03 NOTE — Telephone Encounter (Signed)
-----   Message from [place] Long sent at 04/03/2022 11:51 AM EDT -----  Subject: Message to Provider    QUESTIONS  Information for Provider? Patient was calling to speak with the nurse   about [facility] he said they told him they see where   the chart was created but he isn't assign to anyone and he would like to   know if he needs a new referral or if the doctor just have to assign him   to someone.  ---------------------------------------------------------------------------  --------------  Michael Gonzales INFO  [phone]; OK to leave message on voicemail  ---------------------------------------------------------------------------  --------------  SCRIPT ANSWERS  Relationship to Patient? Self

## 2022-04-25 NOTE — Telephone Encounter (Signed)
Called and spoke with pt whom stated he needs the referral to be sent to [facility] [facility] and was given their fax [phone]

## 2022-04-25 NOTE — Telephone Encounter (Signed)
-----   Message from Rosendo Gros sent at 04/25/2022 10:09 AM EDT -----  Subject: Referral Request    Reason for referral request? Orthopedics for knee replacement  Provider patient wants to be referred to(if known):     Provider Phone Number(if known):    Additional Information for Provider? Patient had a referral sent from Dr   Elyn Peers to [facility] [place]. They called him to set up an appt but he needs   this referral to go through the [place] [facility]   [facility]. Patient is requesting the Ortho referral be sent to the   [place] [facility] for approval and then resent to the [facility] [place]. Please call the   patient to advise.   ---------------------------------------------------------------------------  --------------  Cleotis Lema INFO    [phone]; OK to leave message on voicemail  ---------------------------------------------------------------------------  --------------

## 2022-05-05 NOTE — Telephone Encounter (Signed)
Attempted to call x2. Message received "not available"  Would like to advise Orthopedic referral approved. Any questions, please call [phone].

## 2022-05-29 ENCOUNTER — Ambulatory Visit: Attending: Family Medicine | Primary: Family Medicine

## 2022-05-29 ENCOUNTER — Ambulatory Visit: Payer: PRIVATE HEALTH INSURANCE | Attending: Family Medicine | Primary: Family Medicine

## 2022-06-22 ENCOUNTER — Ambulatory Visit
Admit: 2022-06-22 | Discharge: 2022-06-22 | Payer: PRIVATE HEALTH INSURANCE | Attending: Family Medicine | Primary: Family Medicine

## 2022-06-22 DIAGNOSIS — G43001 Migraine without aura, not intractable, with status migrainosus: Secondary | ICD-10-CM

## 2022-06-22 MED ORDER — OMEPRAZOLE 40 MG PO CPDR
40 | ORAL_CAPSULE | Freq: Every day | ORAL | 1 refills | Status: DC
Start: 2022-06-22 — End: 2024-08-13

## 2022-06-22 MED ORDER — TIZANIDINE HCL 4 MG PO TABS
4 | ORAL_TABLET | Freq: Three times a day (TID) | ORAL | 2 refills | Status: DC | PRN
Start: 2022-06-22 — End: 2024-08-13

## 2022-06-22 MED ORDER — ELETRIPTAN HYDROBROMIDE 40 MG PO TABS
40 | ORAL_TABLET | Freq: Once | ORAL | 5 refills | Status: DC | PRN
Start: 2022-06-22 — End: 2024-08-13

## 2022-06-22 NOTE — Progress Notes (Signed)
[facility]  Chief Complaint   Patient presents with    Follow-up       History of Present Illness:   Michael Gonzales is a 51 y.o. male       HPI:  Here for f/u L knee pain x years. I referred him to ortho through the [place] but they did not reach out. Bone-on bone on xrays last 2021. Had seen ortho and had multiple injections and did PT in [place]. No improvement with either and advised TKR. [facility]. [facility]   H/o migranes and gerd, needs refills today. Symptoms stable.    Health Maintenance  Health Maintenance Due   Topic Date Due    COVID-19 Vaccine (1) Never done    HIV screen  Never done    Hepatitis C screen  Never done    Diabetes screen  Never done    Lipids  Never done    Flu vaccine (1) Never done       Past Medical, Family, and Social History:     Past Medical History:   Diagnosis Date    Calculus of kidney     Chronic low back pain     GERD (gastroesophageal reflux disease)     Headache     Vitamin D deficiency       Past Surgical History:   Procedure Laterality Date    ANTERIOR CRUCIATE LIGAMENT REPAIR Left     ORTHOPEDIC SURGERY      back surgery, R wrist       Current Outpatient Medications on File Prior to Visit   Medication Sig Dispense Refill    ondansetron (ZOFRAN-ODT) 4 MG disintegrating tablet DISSOLVE 1 TABLET IN MOUTH EVERY 6 HOURS AS NEEDED FOR VOMITING OR NAUSEA FOR UP TO 7 DAYS      tamsulosin (FLOMAX) 0.4 MG capsule Take 1 capsule by mouth daily As needed       No current facility-administered medications on file prior to visit.       Patient Active Problem List   Diagnosis    Migraine without aura and with status migrainosus, not intractable    Dyslipidemia    GERD (gastroesophageal reflux disease)    Back pain    Rotator cuff disorder    Vitamin D deficiency    Chronic pain of left knee    Complete tear, knee, anterior cruciate ligament    Obesity    Tear of meniscus of knee    Osteoarthritis of knee    Sensorineural  hearing loss (SNHL)       Social History     Socioeconomic History    Marital status: Single     Spouse name: None    Number of children: None    Years of education: None    Highest education level: None   Tobacco Use    Smoking status: Never    Smokeless tobacco: Never   Substance and Sexual Activity    Alcohol use: Not Currently    Drug use: Never     Social Determinants of Health     Financial Resource Strain: Low Risk     Difficulty of Paying Living Expenses: Not hard at all   Food Insecurity: No Food Insecurity    Worried About Programme researcher, broadcasting/film/video in the Last Year: Never true    Ran Out of Food in the Last Year: Never true   Transportation Needs:  No Transportation Needs    Freight forwarder (Medical): No    Lack of Transportation (Non-Medical): No   Housing Stability: Unknown    Unable to Pay for Housing in the Last Year: No    Unstable Housing in the Last Year: No        Review of Systems   Review of Systems   Constitutional:  Positive for unexpected weight change.   Respiratory:  Negative for cough and shortness of breath.    Cardiovascular:  Negative for chest pain.   Musculoskeletal:  Positive for arthralgias.       Objective:   BP 139/83   Pulse 77   Temp 97.2 F (36.2 C) (Oral)   Resp 20   Ht 6\' 2"  (1.88 m)   Wt 297 lb (134.7 kg)   SpO2 98%   BMI 38.13 kg/m    Physical Exam   PHYSICAL EXAM:  Gen: Pt sitting in chair, in NAD, obese  Head: Normocephalic, atraumatic  Eyes: Sclera anicteric, EOM grossly intact,  Ears: TM's pearly with good light reflex b/l  Throat: MMM, normal lips, tongue, teeth and gums  Neck: Supple, no LAD, no thyromegaly or carotid bruits  CVS: Normal S1, S2, no m/r/g  Resp: CTAB, no wheezes or rales  Neuro: Alert, oriented, appropriate        Assessment and orders:       ICD-10-CM    1. Migraine without aura and with status migrainosus, not intractable  G43.001 eletriptan (RELPAX) 40 MG tablet      2. Chronic pain of left knee  M25.562 tiZANidine (ZANAFLEX) 4 MG tablet     G89.29       3. Gastroesophageal reflux disease without esophagitis  K21.9 omeprazole (PRILOSEC) 40 MG delayed release capsule      4. Dyslipidemia  E78.5 Lipid Panel     Lipid Panel      5. Vitamin D deficiency  E55.9 Vitamin D 25 Hydroxy     Vitamin D 25 Hydroxy      6. Hyperglycemia, unspecified  R73.9 Hemoglobin A1C     Hemoglobin A1C      7. Abnormal weight gain  R63.5 TSH     TSH      8. Long term use of drug  Z79.899 CBC with Auto Differential     Comprehensive Metabolic Panel     Comprehensive Metabolic Panel     CBC with Auto Differential      9. Need for hepatitis C screening test  Z11.59 Hepatitis C Antibody     Hepatitis C Antibody      10. Screening for viral disease  Z11.59 HIV 1/2 Ag/Ab, 4TH Generation,W Rflx Confirm     HIV 1/2 Ag/Ab, 4TH Generation,W Rflx Confirm        1. Migraine without aura and with status migrainosus, not intractable  -     eletriptan (RELPAX) 40 MG tablet; Take 1 tablet by mouth once as needed (headache), Disp-6 tablet, R-5Normal  2. Chronic pain of left knee  -     tiZANidine (ZANAFLEX) 4 MG tablet; Take 1 tablet by mouth 3 times daily as needed (spasm), Disp-90 tablet, R-2Normal  3. Gastroesophageal reflux disease without esophagitis  -     omeprazole (PRILOSEC) 40 MG delayed release capsule; Take 1 capsule by mouth every morning (before breakfast), Disp-90 capsule, R-1Normal  4. Dyslipidemia  -     Lipid Panel; Future  5. Vitamin D deficiency  -  Vitamin D 25 Hydroxy; Future  6. Hyperglycemia, unspecified  -     Hemoglobin A1C; Future  7. Abnormal weight gain  -     TSH; Future  8. Long term use of drug  -     CBC with Auto Differential; Future  -     Comprehensive Metabolic Panel; Future  9. Need for hepatitis C screening test  -     Hepatitis C Antibody; Future  10. Screening for viral disease  -     HIV 1/2 Ag/Ab, 4TH Generation,W Rflx Confirm; Future  Get flu and cov19 booster at pharmacy this fall.   Current treatment plan is effective, no change in therapy , lab  results and schedule of future lab studies reviewed with patient, reviewed diet, exercise and weight control, and reviewed medications and side effects in detail  Return in about 6 months (around 12/23/2022) for GERD, HLD.   I have discussed the diagnosis with the patient and the intended plan as seen in the above orders.  Social history, medical history, and labs were reviewed.  The patient has received an after-visit summary and questions were answered concerning future plans.  I have discussed medication side effects and warnings with the patient as well. Patient verbalized understanding and accepts plan & risks.   Please note that this dictation was completed with Dragon, the computer voice recognition software.  Quite often unanticipated grammatical, syntax, homophones, and other interpretive errors are inadvertently transcribed by the computer software.  Please disregard these errors.  Please excuse any errors that have escaped final proofreading.  Thank you.     Marni Griffon, MD  [facility]  06/22/22

## 2022-06-22 NOTE — Progress Notes (Signed)
1. "Have you been to the ER, urgent care clinic since your last visit?  Hospitalized since your last visit?" NO    2. "Have you seen or consulted any other health care providers outside of the [facility] since your last visit?" no    3. For patients aged 49-75: Has the patient had a colonoscopy / FIT/ Cologuard? N/A      If the patient is male:    4. For patients aged 80-74: Has the patient had a mammogram within the past 2 years? N/A      5. For patients aged 21-65: Has the patient had a pap smear? N/A    Health Maintenance Due   Topic Date Due    COVID-19 Vaccine (1) Never done    HIV screen  Never done    Hepatitis C screen  Never done    Diabetes screen  Never done    Lipids  Never done    DTaP/Tdap/Td vaccine (2 - Td or Tdap) 10/30/2021    Flu vaccine (1) 06/20/2022

## 2022-06-22 NOTE — Patient Instructions (Addendum)
Patient Education   Get flu and [phone] booster at pharmacy this fall.      DASH Diet: Care Instructions  Your Care Instructions     The DASH diet is an eating plan that can help lower your blood pressure. DASH stands for Dietary Approaches to Stop Hypertension. Hypertension is high blood pressure.  The DASH diet focuses on eating foods that are high in calcium, potassium, and magnesium. These nutrients can lower blood pressure. The foods that are highest in these nutrients are fruits, vegetables, low-fat dairy products, nuts, seeds, and legumes. But taking calcium, potassium, and magnesium supplements instead of eating foods that are high in those nutrients does not have the same effect. The DASH diet also includes whole grains, fish, and poultry.  The DASH diet is one of several lifestyle changes your doctor may recommend to lower your high blood pressure. Your doctor may also want you to decrease the amount of sodium in your diet. Lowering sodium while following the DASH diet can lower blood pressure even further than just the DASH diet alone.  Follow-up care is a key part of your treatment and safety. Be sure to make and go to all appointments, and call your doctor if you are having problems. It's also a good idea to know your test results and keep a list of the medicines you take.  How can you care for yourself at home?  Following the DASH diet  Eat 4 to 5 servings of fruit each day. A serving is 1 medium-sized piece of fruit,  cup chopped or canned fruit, 1/4 cup dried fruit, or 4 ounces ( cup) of fruit juice. Choose fruit more often than fruit juice.  Eat 4 to 5 servings of vegetables each day. A serving is 1 cup of lettuce or raw leafy vegetables,  cup of chopped or cooked vegetables, or 4 ounces ( cup) of vegetable juice. Choose vegetables more often than vegetable juice.  Get 2 to 3 servings of low-fat and fat-free dairy each day. A serving is 8 ounces of milk, 1 cup of yogurt, or 1  ounces of  cheese.  Eat 6 to 8 servings of grains each day. A serving is 1 slice of bread, 1 ounce of dry cereal, or  cup of cooked rice, pasta, or cooked cereal. Try to choose whole-grain products as much as possible.  Limit lean meat, poultry, and fish to 2 servings each day. A serving is 3 ounces, about the size of a deck of cards.  Eat 4 to 5 servings of nuts, seeds, and legumes (cooked dried beans, lentils, and split peas) each week. A serving is 1/3 cup of nuts, 2 tablespoons of seeds, or  cup of cooked beans or peas.  Limit fats and oils to 2 to 3 servings each day. A serving is 1 teaspoon of vegetable oil or 2 tablespoons of salad dressing.  Limit sweets and added sugars to 5 servings or less a week. A serving is 1 tablespoon jelly or jam,  cup sorbet, or 1 cup of lemonade.  Eat less than 2,300 milligrams (mg) of sodium a day. If you limit your sodium to 1,500 mg a day, you can lower your blood pressure even more.  Be aware that all of these are the suggested number of servings for people who eat 1,800 to 2,000 calories a day. Your recommended number of servings may be different if you need more or fewer calories.  Tips for success  Start small. Do not  try to make dramatic changes to your diet all at once. You might feel that you are missing out on your favorite foods and then be more likely to not follow the plan. Make small changes, and stick with them. Once those changes become habit, add a few more changes.  Try some of the following:  Make it a goal to eat a fruit or vegetable at every meal and at snacks. This will make it easy to get the recommended amount of fruits and vegetables each day.  Try yogurt topped with fruit and nuts for a snack or healthy dessert.  Add lettuce, tomato, cucumber, and onion to sandwiches.  Combine a ready-made pizza crust with low-fat mozzarella cheese and lots of vegetable toppings. Try using tomatoes, squash, spinach, broccoli, carrots, cauliflower, and onions.  Have a variety of  cut-up vegetables with a low-fat dip as an appetizer instead of chips and dip.  Sprinkle sunflower seeds or chopped almonds over salads. Or try adding chopped walnuts or almonds to cooked vegetables.  Try some vegetarian meals using beans and peas. Add garbanzo or kidney beans to salads. Make burritos and tacos with mashed pinto beans or black beans.  Where can you learn more?  Go to https://www.bennett.info/ and enter H967 to learn more about "DASH Diet: Care Instructions."  Current as of: January 18, 2022               Content Version: 13.7   2006-2023 [employer].   Care instructions adapted under license by [facility]. If you have questions about a medical condition or this instruction, always ask your healthcare professional. [place] any warranty or liability for your use of this information.

## 2022-06-23 LAB — COMPREHENSIVE METABOLIC PANEL
ALT: 37 U/L (ref 12–78)
AST: 19 U/L (ref 15–37)
Albumin/Globulin Ratio: 1.3 (ref 1.1–2.2)
Albumin: 3.7 g/dL (ref 3.5–5.0)
Alk Phosphatase: 118 U/L — ABNORMAL HIGH (ref 45–117)
Anion Gap: 6 mmol/L (ref 5–15)
BUN: 11 MG/DL (ref 6–20)
Bun/Cre Ratio: 13 (ref 12–20)
CO2: 25 mmol/L (ref 21–32)
Calcium: 8.9 MG/DL (ref 8.5–10.1)
Chloride: 109 mmol/L — ABNORMAL HIGH (ref 97–108)
Creatinine: 0.82 MG/DL (ref 0.70–1.30)
Est, Glom Filt Rate: >60 mL/min/{1.73_m2} (ref >60)
Globulin: 2.8 g/dL (ref 2.0–4.0)
Glucose: 96 mg/dL (ref 65–100)
Potassium: 4.5 mmol/L (ref 3.5–5.1)
Sodium: 140 mmol/L (ref 136–145)
Total Bilirubin: 0.3 MG/DL (ref 0.2–1.0)
Total Protein: 6.5 g/dL (ref 6.4–8.2)

## 2022-06-23 LAB — CBC WITH AUTO DIFFERENTIAL
Absolute Immature Granulocyte: 0.1 10*3/uL — ABNORMAL HIGH (ref 0.00–0.04)
Basophils %: 1 % (ref 0–1)
Basophils Absolute: 0.1 10*3/uL (ref 0.0–0.1)
Eosinophils %: 2 % (ref 0–7)
Eosinophils Absolute: 0.2 10*3/uL (ref 0.0–0.4)
Hematocrit: 50.4 % — ABNORMAL HIGH (ref 36.6–50.3)
Hemoglobin: 15.9 g/dL (ref 12.1–17.0)
Immature Granulocytes: 1 % — ABNORMAL HIGH (ref 0.0–0.5)
Lymphocytes %: 21 % (ref 12–49)
Lymphocytes Absolute: 1.9 10*3/uL (ref 0.8–3.5)
MCH: 28.4 PG (ref 26.0–34.0)
MCHC: 31.5 g/dL (ref 30.0–36.5)
MCV: 90 FL (ref 80.0–99.0)
MPV: 10.1 FL (ref 8.9–12.9)
Monocytes %: 7 % (ref 5–13)
Monocytes Absolute: 0.6 10*3/uL (ref 0.0–1.0)
Neutrophils %: 68 % (ref 32–75)
Neutrophils Absolute: 6.3 10*3/uL (ref 1.8–8.0)
Nucleated RBCs: 0 PER 100 WBC
Platelets: 343 10*3/uL (ref 150–400)
RBC: 5.6 M/uL (ref 4.10–5.70)
RDW: 13.1 % (ref 11.5–14.5)
WBC: 9.1 10*3/uL (ref 4.1–11.1)
nRBC: 0 10*3/uL (ref 0.00–0.01)

## 2022-06-23 LAB — LIPID PANEL
Chol/HDL Ratio: 6.6 — ABNORMAL HIGH (ref 0.0–5.0)
Cholesterol, Total: 204 MG/DL — ABNORMAL HIGH (ref <200)
HDL: 31 MG/DL
LDL Calculated: 101.8 MG/DL — ABNORMAL HIGH (ref 0–100)
Triglycerides: 356 MG/DL — ABNORMAL HIGH (ref <150)
VLDL Cholesterol Calculated: 71.2 MG/DL

## 2022-06-23 LAB — HEPATITIS C ANTIBODY: Interpretation: NONREACTIVE

## 2022-06-23 LAB — HEMOGLOBIN A1C
Hemoglobin A1C: 5.2 % (ref 4.0–5.6)
eAG: 103 mg/dL

## 2022-06-23 LAB — HIV 1/2 AG/AB, 4TH GENERATION,W RFLX CONFIRM: HIV 1/2 Interp: NONREACTIVE

## 2022-06-23 LAB — TSH: TSH, 3RD GENERATION: 3.17 u[IU]/mL (ref 0.36–3.74)

## 2022-06-23 LAB — VITAMIN D 25 HYDROXY: Vit D, 25-Hydroxy: 29.3 ng/mL — ABNORMAL LOW (ref 30–100)

## 2022-06-28 MED ORDER — SUMATRIPTAN SUCCINATE 50 MG PO TABS
50 | ORAL_TABLET | ORAL | 0 refills | Status: DC
Start: 2022-06-28 — End: 2024-08-13

## 2022-06-28 NOTE — Telephone Encounter (Signed)
Pharmacy states the Relpax is not approved in their formularies. Pt can try Sumatriptan, Zolmitriptan, or Rizatriptan. Please send in a replacement Rx for the Relpax.

## 2022-06-28 NOTE — Telephone Encounter (Signed)
Ordered Sumatriptan for patient.    Linnell Fulling, MD  [facility]  06/28/22

## 2022-06-29 NOTE — Telephone Encounter (Signed)
Attempted to call pharmacy, unable to get through. MyChart message sent to patient.

## 2022-08-02 ENCOUNTER — Inpatient Hospital Stay
Admit: 2022-08-02 | Discharge: 2022-08-02 | Disposition: A | Discharge status: Home / Self Care | Payer: PRIVATE HEALTH INSURANCE | Attending: Emergency Medicine

## 2022-08-02 ENCOUNTER — Emergency Department: Admit: 2022-08-02 | Payer: PRIVATE HEALTH INSURANCE | Primary: Family Medicine

## 2022-08-02 DIAGNOSIS — R1011 Right upper quadrant pain: Secondary | ICD-10-CM

## 2022-08-02 LAB — URINALYSIS
Bilirubin Urine: NEGATIVE
Blood, Urine: NEGATIVE
Glucose, UA: NEGATIVE mg/dL
Ketones, Urine: NEGATIVE mg/dL
Leukocyte Esterase, Urine: NEGATIVE
Nitrite, Urine: NEGATIVE
Protein, UA: NEGATIVE mg/dL
Specific Gravity, UA: 1.024 (ref 1.003–1.030)
Urobilinogen, Urine: 0.1 EU/dL (ref 0.1–1.0)
pH, Urine: 7 (ref 5.0–8.0)

## 2022-08-02 LAB — CBC WITH AUTO DIFFERENTIAL
Absolute Immature Granulocyte: 0.1 10*3/uL — ABNORMAL HIGH (ref 0.00–0.04)
Basophils %: 1 % (ref 0–1)
Basophils Absolute: 0.1 10*3/uL (ref 0.0–0.1)
Eosinophils %: 1 % (ref 0–7)
Eosinophils Absolute: 0.1 10*3/uL (ref 0.0–0.4)
Hematocrit: 45.8 % (ref 36.6–50.3)
Hemoglobin: 15.8 g/dL (ref 12.1–17.0)
Immature Granulocytes: 1 % — ABNORMAL HIGH (ref 0–0.5)
Lymphocytes %: 19 % (ref 12–49)
Lymphocytes Absolute: 2.5 10*3/uL (ref 0.8–3.5)
MCH: 28.4 PG (ref 26.0–34.0)
MCHC: 34.5 g/dL (ref 30.0–36.5)
MCV: 82.4 FL (ref 80.0–99.0)
MPV: 9.8 FL (ref 8.9–12.9)
Monocytes %: 6 % (ref 5–13)
Monocytes Absolute: 0.9 10*3/uL (ref 0.0–1.0)
Neutrophils %: 72 % (ref 32–75)
Neutrophils Absolute: 9.7 10*3/uL — ABNORMAL HIGH (ref 1.8–8.0)
Nucleated RBCs: 0 PER 100 WBC
Platelets: 347 10*3/uL (ref 150–400)
RBC: 5.56 M/uL (ref 4.10–5.70)
RDW: 12.5 % (ref 11.5–14.5)
WBC: 13.3 10*3/uL — ABNORMAL HIGH (ref 4.1–11.1)
nRBC: 0 10*3/uL (ref 0.00–0.01)

## 2022-08-02 LAB — COMPREHENSIVE METABOLIC PANEL
ALT: 36 U/L (ref 12–78)
AST: 17 U/L (ref 15–37)
Albumin/Globulin Ratio: 1 — ABNORMAL LOW (ref 1.1–2.2)
Albumin: 3.8 g/dL (ref 3.5–5.0)
Alk Phosphatase: 118 U/L — ABNORMAL HIGH (ref 45–117)
Anion Gap: 6 mmol/L (ref 5–15)
BUN: 13 mg/dL (ref 6–20)
Bun/Cre Ratio: 13 (ref 12–20)
CO2: 26 mmol/L (ref 21–32)
Calcium: 9 mg/dL (ref 8.5–10.1)
Chloride: 104 mmol/L (ref 97–108)
Creatinine: 0.99 mg/dL (ref 0.70–1.30)
Est, Glom Filt Rate: >60 mL/min/{1.73_m2} (ref >60)
Globulin: 3.7 g/dL (ref 2.0–4.0)
Glucose: 100 mg/dL (ref 65–100)
Potassium: 4 mmol/L (ref 3.5–5.1)
Sodium: 136 mmol/L (ref 136–145)
Total Bilirubin: 0.5 mg/dL (ref 0.2–1.0)
Total Protein: 7.5 g/dL (ref 6.4–8.2)

## 2022-08-02 LAB — LIPASE: Lipase: 85 U/L (ref 73–393)

## 2022-08-02 MED ORDER — KETOROLAC TROMETHAMINE 15 MG/ML IJ SOLN
15 MG/ML | INTRAMUSCULAR | Status: AC
Start: 2022-08-02 — End: 2022-08-02
  Administered 2022-08-02: 16:00:00 15 mg via INTRAVENOUS

## 2022-08-02 MED ORDER — IOPAMIDOL 76 % IV SOLN
76 % | Freq: Once | INTRAVENOUS | Status: AC | PRN
Start: 2022-08-02 — End: 2022-08-02
  Administered 2022-08-02: 16:00:00 100 mL via INTRAVENOUS

## 2022-08-02 MED ORDER — ONDANSETRON HCL 4 MG/2ML IJ SOLN
4 MG/2ML | Freq: Once | INTRAMUSCULAR | Status: AC
Start: 2022-08-02 — End: 2022-08-02
  Administered 2022-08-02: 16:00:00 4 mg via INTRAVENOUS

## 2022-08-02 MED ORDER — KETOROLAC TROMETHAMINE 10 MG PO TABS
10 MG | ORAL_TABLET | Freq: Four times a day (QID) | ORAL | 0 refills | Status: AC | PRN
Start: 2022-08-02 — End: ?

## 2022-08-02 MED ORDER — ONDANSETRON 4 MG PO TBDP
4 MG | ORAL_TABLET | Freq: Three times a day (TID) | ORAL | 0 refills | Status: AC | PRN
Start: 2022-08-02 — End: ?

## 2022-08-02 MED FILL — ONDANSETRON HCL 4 MG/2ML IJ SOLN: 4 MG/2ML | INTRAMUSCULAR | Qty: 2

## 2022-08-02 MED FILL — KETOROLAC TROMETHAMINE 15 MG/ML IJ SOLN: 15 MG/ML | INTRAMUSCULAR | Qty: 1

## 2022-08-02 MED FILL — ISOVUE-370 76 % IV SOLN: 76 % | INTRAVENOUS | Qty: 100

## 2022-08-02 NOTE — ED Provider Notes (Signed)
[facility] EMERGENCY DEPT  EMERGENCY DEPARTMENT HISTORY AND PHYSICAL EXAM      Date: 08/02/2022  Patient Name: Michael Gonzales  MRN: [id number]  [place] 1971-08-23  Date of evaluation: 08/02/2022  Provider: Amada Jupiter, MD   Note Started: 2:14 PM EDT 08/02/22    HISTORY OF PRESENT ILLNESS     Chief Complaint   Patient presents with    Abdominal Pain       History Provided By: Patient    HPI: Michael Gonzales is a 51 y.o. male presents the emergency room with complaint of intermittent right upper quadrant abdominal pain x3 days.  Patient reports pain is sharp, denies nausea or vomiting, denies fevers or chills.  Patient reports history of kidney stones but states this feels different.    PAST MEDICAL HISTORY   Past Medical History:  Past Medical History:   Diagnosis Date    Calculus of kidney     Chronic low back pain     GERD (gastroesophageal reflux disease)     Headache     Vitamin D deficiency        Past Surgical History:  Past Surgical History:   Procedure Laterality Date    ANTERIOR CRUCIATE LIGAMENT REPAIR Left     ORTHOPEDIC SURGERY      back surgery, R wrist       Family History:  Family History   Problem Relation Age of Onset    Cancer Mother     Cancer Sister     Cancer Brother        Social History:  Social History     Tobacco Use    Smoking status: Never    Smokeless tobacco: Never   Substance Use Topics    Alcohol use: Not Currently    Drug use: Never       Allergies:  No Known Allergies    PCP: Shirley Muscat, MD    Current Meds:   No current facility-administered medications for this encounter.     Current Outpatient Medications   Medication Sig Dispense Refill    ketorolac (TORADOL) 10 MG tablet Take 1 tablet by mouth every 6 hours as needed for Pain 20 tablet 0    ondansetron (ZOFRAN-ODT) 4 MG disintegrating tablet Place 1 tablet under the tongue every 8 hours as needed for Nausea or Vomiting 20 tablet 0    SUMAtriptan (IMITREX) 50 MG tablet Take one tablet at onset of headache then repeat after 1 hour  if needed.  Max daily dose 3 tablets.  Max in a month 9 tablets. 9 tablet 0    eletriptan (RELPAX) 40 MG tablet Take 1 tablet by mouth once as needed (headache) 6 tablet 5    omeprazole (PRILOSEC) 40 MG delayed release capsule Take 1 capsule by mouth every morning (before breakfast) 90 capsule 1    tiZANidine (ZANAFLEX) 4 MG tablet Take 1 tablet by mouth 3 times daily as needed (spasm) 90 tablet 2    ondansetron (ZOFRAN-ODT) 4 MG disintegrating tablet DISSOLVE 1 TABLET IN MOUTH EVERY 6 HOURS AS NEEDED FOR VOMITING OR NAUSEA FOR UP TO 7 DAYS      tamsulosin (FLOMAX) 0.4 MG capsule Take 1 capsule by mouth daily As needed         Social Determinants of Health:   Social Determinants of Health     Tobacco Use: Low Risk  (06/22/2022)    Patient History     Smoking Tobacco Use: Never  Smokeless Tobacco Use: Never     Passive Exposure: Not on file   Alcohol Use: Not At Risk (08/02/2022)    AUDIT-C     Frequency of Alcohol Consumption: Never     Average Number of Drinks: Patient does not drink     Frequency of Binge Drinking: Never   Financial Resource Strain: Low Risk  (11/23/2021)    Overall Financial Resource Strain (CARDIA)     Difficulty of Paying Living Expenses: Not hard at all   Food Insecurity: No Food Insecurity (11/23/2021)    Hunger Vital Sign     Worried About Running Out of Food in the Last Year: Never true     Ran Out of Food in the Last Year: Never true   Transportation Needs: No Transportation Needs (02/08/2022)    PRAPARE - Armed forces logistics/support/administrative officer (Medical): No     Lack of Transportation (Non-Medical): No   Physical Activity: Not on file   Stress: Not on file   Social Connections: Not on file   Intimate Partner Violence: Not on file   Depression: Not at risk (02/08/2022)    PHQ-2     PHQ-2 Score: 0   Housing Stability: Unknown (02/08/2022)    Housing Stability Vital Sign     Unable to Pay for Housing in the Last Year: No     Number of Places Lived in the Last Year: Not on file     Unstable  Housing in the Last Year: No       PHYSICAL EXAM   Physical Exam  Physical Exam  Constitutional:       General: Uncomfortable but not toxic-appearing.   HENT:      Head: Normocephalic and atraumatic.      Nose: Nose normal.      Mouth/Throat:      Mouth: Mucous membranes are moist.   Eyes:      Extraocular Movements: Extraocular movements intact.      Pupils: Pupils are equal, round, and reactive to light.   Cardiovascular:      Rate and Rhythm: Normal rate.      Pulses: Normal pulses.   Pulmonary:      Effort: Pulmonary effort is normal.      Breath sounds: No stridor.   Abdominal:      General: Abdomen is flat. There is no distension.  Right upper quadrant tenderness to palpation with voluntary guarding without rebound.  Musculoskeletal:         General: Normal range of motion.      Cervical back: Normal range of motion and neck supple.   Skin:     General: Skin is warm and dry.      Capillary Refill: Capillary refill takes less than 2 seconds.   Neurological:      General: No focal deficit present.      Mental Status: Aert and oriented to person, place, and time.   Psychiatric:         Mood and Affect: Mood normal.         Behavior: Behavior normal.       SCREENINGS               LAB, EKG AND DIAGNOSTIC RESULTS   Labs:  Recent Results (from the past 12 hour(s))   CBC with Diff    Collection Time: 08/02/22 11:30 AM   Result Value Ref Range    WBC 13.3 (H) 4.1 - 11.1 K/uL  RBC 5.56 4.10 - 5.70 M/uL    Hemoglobin 15.8 12.1 - 17.0 g/dL    Hematocrit 45.8 36.6 - 50.3 %    MCV 82.4 80.0 - 99.0 FL    MCH 28.4 26.0 - 34.0 PG    MCHC 34.5 30.0 - 36.5 g/dL    RDW 12.5 11.5 - 14.5 %    Platelets 347 150 - 400 K/uL    MPV 9.8 8.9 - 12.9 FL    Nucleated RBCs 0.0 0.0 PER 100 WBC    nRBC 0.00 0.00 - 0.01 K/uL    Neutrophils % 72 32 - 75 %    Lymphocytes % 19 12 - 49 %    Monocytes % 6 5 - 13 %    Eosinophils % 1 0 - 7 %    Basophils % 1 0 - 1 %    Immature Granulocytes 1 (H) 0 - 0.5 %    Neutrophils Absolute 9.7 (H) 1.8 - 8.0  K/UL    Lymphocytes Absolute 2.5 0.8 - 3.5 K/UL    Monocytes Absolute 0.9 0.0 - 1.0 K/UL    Eosinophils Absolute 0.1 0.0 - 0.4 K/UL    Basophils Absolute 0.1 0.0 - 0.1 K/UL    Absolute Immature Granulocyte 0.1 (H) 0.00 - 0.04 K/UL    Differential Type AUTOMATED     CMP    Collection Time: 08/02/22 11:30 AM   Result Value Ref Range    Sodium 136 136 - 145 mmol/L    Potassium 4.0 3.5 - 5.1 mmol/L    Chloride 104 97 - 108 mmol/L    CO2 26 21 - 32 mmol/L    Anion Gap 6 5 - 15 mmol/L    Glucose 100 65 - 100 mg/dL    BUN 13 6 - 20 mg/dL    Creatinine 0.99 0.70 - 1.30 mg/dL    Bun/Cre Ratio 13 12 - 20      Est, Glom Filt Rate >60 >60 ml/min/1.53m    Calcium 9.0 8.5 - 10.1 mg/dL    Total Bilirubin 0.5 0.2 - 1.0 mg/dL    AST 17 15 - 37 U/L    ALT 36 12 - 78 U/L    Alk Phosphatase 118 (H) 45 - 117 U/L    Total Protein 7.5 6.4 - 8.2 g/dL    Albumin 3.8 3.5 - 5.0 g/dL    Globulin 3.7 2.0 - 4.0 g/dL    Albumin/Globulin Ratio 1.0 (L) 1.1 - 2.2     Lipase    Collection Time: 08/02/22 11:30 AM   Result Value Ref Range    Lipase 85 73 - 393 U/L   Urinalysis    Collection Time: 08/02/22 12:16 PM   Result Value Ref Range    Color, UA Yellow/Straw      Appearance Clear Clear      Specific Gravity, UA 1.024 1.003 - 1.030      pH, Urine 7.0 5.0 - 8.0      Protein, UA Negative Negative mg/dL    Glucose, UA Negative Negative mg/dL    Ketones, Urine Negative Negative mg/dL    Bilirubin Urine Negative Negative      Blood, Urine Negative Negative      Urobilinogen, Urine 0.1 0.1 - 1.0 EU/dL    Nitrite, Urine Negative Negative      Leukocyte Esterase, Urine Negative Negative         EKG:.EKG interpreted by me. Shows Normal Sinus Rhythm with a HR of  80 bpm.  Cannot rule out anterior infarct, age undetermined.  Abnormal EKG..  No STEMI.    Radiologic Studies:  Non-plain film images such as CT, Ultrasound and MRI are read by the radiologist. Plain radiographic images are visualized and preliminarily interpreted by the ED Provider with the  following findings: Not Applicable.    Interpretation per the Radiologist below, if available at the time of this note:  CT ABDOMEN PELVIS W IV CONTRAST Additional Contrast? None   Final Result         Mild fatty liver otherwise no acute abnormality           EMERGENCY DEPARTMENT COURSE and DIFFERENTIAL DIAGNOSIS/MDM   CC/HPI Summary, DDx, ED Course, and Reassessment: Patient with history of kidney stones and uncomfortable appearing consistent with the same.  However given he has more right upper quadrant pain also concern for liver or gallbladder disease.  Less likely obstructive pathology as patient is not vomiting and is afebrile.    Clinical Management Tools:  Not Applicable    Records Reviewed (source and summary of external notes): Prior medical records and Nursing notes    Vitals:    Vitals:    08/02/22 1108 08/02/22 1130   BP: (!) 160/98 (!) 151/95   Pulse: 87 91   Resp: 19 18   Temp: 98 F (36.7 C)    TempSrc: Oral    SpO2: 97% 96%   Weight: 131.5 kg (290 lb)    Height: 1.854 m ('6\' 1"' )         ED COURSE       Disposition Considerations (Tests not done, Shared Decision Making, Pt Expectation of Test or Treatment.): See MDM    Patient was given the following medications:  Medications   ondansetron (ZOFRAN) injection 4 mg (4 mg IntraVENous Given 08/02/22 1134)   ketorolac (TORADOL) injection 15 mg (15 mg IntraVENous Given 08/02/22 1134)   iopamidol (ISOVUE-370) 76 % injection 100 mL (100 mLs IntraVENous Given 08/02/22 1154)       CONSULTS: (Who and What was discussed)  None     Social Determinants affecting Dx or Tx: None    Smoking Cessation: Not Applicable    PROCEDURES   Unless otherwise noted above, none  Procedures      CRITICAL CARE TIME   Patient does not meet Critical Care Time, 0 minutes    ED FINAL IMPRESSION     1. Right upper quadrant abdominal pain          DISPOSITION/PLAN   DISPOSITION Decision To Discharge 08/02/2022 02:13:02 PM    Discharge Note: The patient is stable for discharge home. The  signs, symptoms, diagnosis, and discharge instructions have been discussed, understanding conveyed, and agreed upon. The patient is to follow up as recommended or return to ER should their symptoms worsen.      PATIENT REFERRED TO:  Shirley Muscat, MD  [place] [address]  [phone]          [place], [place] [address]  [phone]    Schedule an appointment as soon as possible for a visit       Alm Bustard, MD  [address]  [address]  [place] [address]  [phone]      As needed        DISCHARGE MEDICATIONS:     Medication List        START  taking these medications      ketorolac 10 MG tablet  Commonly known as: TORADOL  Take 1 tablet by mouth every 6 hours as needed for Pain            CHANGE how you take these medications      * ondansetron 4 MG disintegrating tablet  Commonly known as: ZOFRAN-ODT  What changed: Another medication with the same name was added. Make sure you understand how and when to take each.     * ondansetron 4 MG disintegrating tablet  Commonly known as: ZOFRAN-ODT  Place 1 tablet under the tongue every 8 hours as needed for Nausea or Vomiting  What changed: You were already taking a medication with the same name, and this prescription was added. Make sure you understand how and when to take each.           * This list has 2 medication(s) that are the same as other medications prescribed for you. Read the directions carefully, and ask your doctor or other care provider to review them with you.                ASK your doctor about these medications      eletriptan 40 MG tablet  Commonly known as: RELPAX  Take 1 tablet by mouth once as needed (headache)     omeprazole 40 MG delayed release capsule  Commonly known as: PRILOSEC  Take 1 capsule by mouth every morning (before breakfast)     SUMAtriptan 50 MG tablet  Commonly known as: IMITREX  Take one tablet at onset of headache then repeat after 1 hour if needed.   Max daily dose 3 tablets.  Max in a month 9 tablets.     tamsulosin 0.4 MG capsule  Commonly known as: FLOMAX     tiZANidine 4 MG tablet  Commonly known as: ZANAFLEX  Take 1 tablet by mouth 3 times daily as needed (spasm)               Where to Get Your Medications        These medications were sent to [facility]  [place], [place] [address]      Phone: [phone]   ketorolac 10 MG tablet  ondansetron 4 MG disintegrating tablet           DISCONTINUED MEDICATIONS:  Current Discharge Medication List          I am the Primary Clinician of Record. Amada Jupiter, MD (electronically signed)    (Please note that parts of this dictation were completed with voice recognition software. Quite often unanticipated grammatical, syntax, homophones, and other interpretive errors are inadvertently transcribed by the computer software. Please disregards these errors. Please excuse any errors that have escaped final proofreading.)     Amada Jupiter, MD  08/02/22 1416

## 2022-08-02 NOTE — Discharge Instructions (Signed)
Thank you!  Thank you for allowing me to care for you in the emergency department. It is my goal to provide you with excellent care. If you have not received excellent quality care, please ask to speak to the nurse manager. Please fill out the survey that will come to you by mail or email since we listen to your feedback!     Below you will find a list of your tests from today's visit.  Should you have any questions, please do not hesitate to call the emergency department.    Labs  Recent Results (from the past 12 hour(s))   CBC with Diff    Collection Time: 08/02/22 11:30 AM   Result Value Ref Range    WBC 13.3 (H) 4.1 - 11.1 K/uL    RBC 5.56 4.10 - 5.70 M/uL    Hemoglobin 15.8 12.1 - 17.0 g/dL    Hematocrit 45.8 36.6 - 50.3 %    MCV 82.4 80.0 - 99.0 FL    MCH 28.4 26.0 - 34.0 PG    MCHC 34.5 30.0 - 36.5 g/dL    RDW 12.5 11.5 - 14.5 %    Platelets 347 150 - 400 K/uL    MPV 9.8 8.9 - 12.9 FL    Nucleated RBCs 0.0 0.0 PER 100 WBC    nRBC 0.00 0.00 - 0.01 K/uL    Neutrophils % 72 32 - 75 %    Lymphocytes % 19 12 - 49 %    Monocytes % 6 5 - 13 %    Eosinophils % 1 0 - 7 %    Basophils % 1 0 - 1 %    Immature Granulocytes 1 (H) 0 - 0.5 %    Neutrophils Absolute 9.7 (H) 1.8 - 8.0 K/UL    Lymphocytes Absolute 2.5 0.8 - 3.5 K/UL    Monocytes Absolute 0.9 0.0 - 1.0 K/UL    Eosinophils Absolute 0.1 0.0 - 0.4 K/UL    Basophils Absolute 0.1 0.0 - 0.1 K/UL    Absolute Immature Granulocyte 0.1 (H) 0.00 - 0.04 K/UL    Differential Type AUTOMATED     CMP    Collection Time: 08/02/22 11:30 AM   Result Value Ref Range    Sodium 136 136 - 145 mmol/L    Potassium 4.0 3.5 - 5.1 mmol/L    Chloride 104 97 - 108 mmol/L    CO2 26 21 - 32 mmol/L    Anion Gap 6 5 - 15 mmol/L    Glucose 100 65 - 100 mg/dL    BUN 13 6 - 20 mg/dL    Creatinine 0.99 0.70 - 1.30 mg/dL    Bun/Cre Ratio 13 12 - 20      Est, Glom Filt Rate >60 >60 ml/min/1.49m    Calcium 9.0 8.5 - 10.1 mg/dL    Total Bilirubin 0.5 0.2 - 1.0 mg/dL    AST 17 15 - 37 U/L    ALT  36 12 - 78 U/L    Alk Phosphatase 118 (H) 45 - 117 U/L    Total Protein 7.5 6.4 - 8.2 g/dL    Albumin 3.8 3.5 - 5.0 g/dL    Globulin 3.7 2.0 - 4.0 g/dL    Albumin/Globulin Ratio 1.0 (L) 1.1 - 2.2     Lipase    Collection Time: 08/02/22 11:30 AM   Result Value Ref Range    Lipase 85 73 - 393 U/L   Urinalysis    Collection Time:  08/02/22 12:16 PM   Result Value Ref Range    Color, UA Yellow/Straw      Appearance Clear Clear      Specific Gravity, UA 1.024 1.003 - 1.030      pH, Urine 7.0 5.0 - 8.0      Protein, UA Negative Negative mg/dL    Glucose, UA Negative Negative mg/dL    Ketones, Urine Negative Negative mg/dL    Bilirubin Urine Negative Negative      Blood, Urine Negative Negative      Urobilinogen, Urine 0.1 0.1 - 1.0 EU/dL    Nitrite, Urine Negative Negative      Leukocyte Esterase, Urine Negative Negative         Radiologic Studies  CT ABDOMEN PELVIS W IV CONTRAST Additional Contrast? None   Final Result         Mild fatty liver otherwise no acute abnormality        ------------------------------------------------------------------------------------------------------------  The exam and treatment you received in the Emergency Department were for an urgent problem and are not intended as complete care. It is important that you follow-up with a doctor, nurse practitioner, or physician assistant to:  (1) confirm your diagnosis,  (2) re-evaluation of changes in your illness and treatment, and  (3) for ongoing care. Please take your discharge instructions with you when you go to your follow-up appointment.     If you have any problem arranging a follow-up appointment, contact the Emergency Department.  If your symptoms become worse or you do not improve as expected and you are unable to reach your health care provider, please return to the Emergency Department. We are available 24 hours a day.     If a prescription has been provided, please have it filled as soon as possible to prevent a delay in treatment. If  you have any questions or reservations about taking the medication due to side effects or interactions with other medications, please call your primary care provider or contact the ER.

## 2022-08-02 NOTE — ED Triage Notes (Signed)
Patient complains of abdominal pain x 3 days

## 2022-08-03 ENCOUNTER — Ambulatory Visit
Admit: 2022-08-03 | Discharge: 2022-08-03 | Payer: PRIVATE HEALTH INSURANCE | Attending: Family Medicine | Primary: Family Medicine

## 2022-08-03 DIAGNOSIS — R1011 Right upper quadrant pain: Secondary | ICD-10-CM

## 2022-08-03 LAB — EKG 12-LEAD
Atrial Rate: 80 {beats}/min
Diagnosis: NORMAL
P Axis: 13 degrees
P-R Interval: 152 ms
Q-T Interval: 384 ms
QRS Duration: 96 ms
QTc Calculation (Bazett): 442 ms
R Axis: -21 degrees
T Axis: 12 degrees
Ventricular Rate: 80 {beats}/min

## 2022-08-03 NOTE — Progress Notes (Signed)
[facility]  Chief Complaint   Patient presents with    ED Follow-up       History of Present Illness:   Michael Gonzales is a 51 y.o. male       HPI:  Seen in ER yesterday due to abd pain x 3 days.   CT done showed fatty liver, otherwise normal.   Normal u/a, wbc 13.3, lipase normal, cmp normal.  Given zofran and toradol. Using that and a muscle relaxer has helped.   Pain in RUQ, radiates to shoulder and back.  H/o gerd, on omeprazole.       Health Maintenance  Health Maintenance Due   Topic Date Due    Hepatitis B vaccine (1 of 3 - 3-dose series) Never done    COVID-19 Vaccine (1) Never done       Past Medical, Family, and Social History:     Past Medical History:   Diagnosis Date    Calculus of kidney     Chronic low back pain     GERD (gastroesophageal reflux disease)     Headache     Vitamin D deficiency       Past Surgical History:   Procedure Laterality Date    ANTERIOR CRUCIATE LIGAMENT REPAIR Left     ORTHOPEDIC SURGERY      back surgery, R wrist       Current Outpatient Medications on File Prior to Visit   Medication Sig Dispense Refill    ketorolac (TORADOL) 10 MG tablet Take 1 tablet by mouth every 6 hours as needed for Pain 20 tablet 0    ondansetron (ZOFRAN-ODT) 4 MG disintegrating tablet Place 1 tablet under the tongue every 8 hours as needed for Nausea or Vomiting 20 tablet 0    SUMAtriptan (IMITREX) 50 MG tablet Take one tablet at onset of headache then repeat after 1 hour if needed.  Max daily dose 3 tablets.  Max in a month 9 tablets. 9 tablet 0    omeprazole (PRILOSEC) 40 MG delayed release capsule Take 1 capsule by mouth every morning (before breakfast) 90 capsule 1    tiZANidine (ZANAFLEX) 4 MG tablet Take 1 tablet by mouth 3 times daily as needed (spasm) 90 tablet 2    tamsulosin (FLOMAX) 0.4 MG capsule Take 1 capsule by mouth daily As needed      eletriptan (RELPAX) 40 MG tablet Take 1 tablet by mouth once as needed (headache) 6 tablet 5     No current facility-administered  medications on file prior to visit.       Patient Active Problem List   Diagnosis    Migraine without aura and with status migrainosus, not intractable    Dyslipidemia    GERD (gastroesophageal reflux disease)    Back pain    Rotator cuff disorder    Vitamin D deficiency    Chronic pain of left knee    Complete tear, knee, anterior cruciate ligament    Obesity    Tear of meniscus of knee    Osteoarthritis of knee    Sensorineural hearing loss (SNHL)       Social History     Socioeconomic History    Marital status: Single     Spouse name: None    Number of children: None    Years of education: None    Highest education level: None   Tobacco Use    Smoking status: Never    Smokeless tobacco: Never  Substance and Sexual Activity    Alcohol use: Not Currently    Drug use: Never     Social Determinants of Health     Financial Resource Strain: Low Risk  (11/23/2021)    Overall Financial Resource Strain (CARDIA)     Difficulty of Paying Living Expenses: Not hard at all   Food Insecurity: No Food Insecurity (11/23/2021)    Hunger Vital Sign     Worried About Running Out of Food in the Last Year: Never true     Ran Out of Food in the Last Year: Never true   Transportation Needs: No Transportation Needs (02/08/2022)    PRAPARE - Therapist, art (Medical): No     Lack of Transportation (Non-Medical): No   Housing Stability: Unknown (02/08/2022)    Housing Stability Vital Sign     Unable to Pay for Housing in the Last Year: No     Unstable Housing in the Last Year: No        Review of Systems   Review of Systems   Constitutional:  Negative for fever.   Gastrointestinal:  Positive for abdominal distention and abdominal pain. Negative for blood in stool, constipation, nausea and vomiting.         Objective:   BP 126/75 (Site: Left Upper Arm, Position: Sitting, Cuff Size: Large Adult)   Pulse 70   Temp 97.4 F (36.3 C) (Oral)   Resp 18   Ht 6\' 1"  (1.854 m)   Wt 293 lb (132.9 kg)   SpO2 98%   BMI 38.66  kg/m    Physical Exam   PHYSICAL EXAM:  Gen: Pt sitting in chair, in NAD  Head: Normocephalic, atraumatic  Eyes: Sclera anicteric, EOM grossly intact,  CVS: Normal S1, S2, no m/r/g  Resp: CTAB, no wheezes, rhonchi or rales  Abd: Soft, tender RUQ, non-distended, no rebound, +BS  Neuro: Alert, oriented, appropriate        Assessment and orders:       ICD-10-CM    1. RUQ pain  R10.11 GALLBLADDER RUQ     NM HEPATOBILIARY SCAN W PHARMACOLOGICAL INTERVENTION      2. Need for vaccination  Z23 Influenza, FLUCELVAX, (age 36 mo+), IM, Preservative Free, 0.5 mL        1. RUQ pain  -     US GALLBLADDER RUQ; Future  -     NM HEPATOBILIARY SCAN W PHARMACOLOGICAL INTERVENTION; Future  2. Need for vaccination  -     Influenza, FLUCELVAX, (age 36 mo+), IM, Preservative Free, 0.5 mL  Will order tests thru community care at [facility] per patient request. If symptoms worsen go back to ER.   Radiology results and schedule of future radiology studies reviewed with patient  Return if symptoms worsen or fail to improve.   I have discussed the diagnosis with the patient and the intended plan as seen in the above orders.  Social history, medical history, and labs were reviewed.  The patient has received an after-visit summary and questions were answered concerning future plans.  I have discussed medication side effects and warnings with the patient as well. Patient verbalized understanding and accepts plan & risks.      [facility], MD  [facility]  08/03/22

## 2022-08-03 NOTE — Progress Notes (Signed)
1. "Have you been to the ER, urgent care clinic since your last visit?  Hospitalized since your last visit?" YES    2. "Have you seen or consulted any other health care providers outside of the [facility] since your last visit?" no    3. For patients aged 49-75: Has the patient had a colonoscopy / FIT/ Cologuard? YES      If the patient is male:    4. For patients aged 72-74: Has the patient had a mammogram within the past 2 years? N/A      5. For patients aged 21-65: Has the patient had a pap smear? N/A    Health Maintenance Due   Topic Date Due    Hepatitis B vaccine (1 of 3 - 3-dose series) Never done    COVID-19 Vaccine (1) Never done    Flu vaccine (1) Never done

## 2022-08-11 ENCOUNTER — Telehealth

## 2022-08-11 NOTE — Telephone Encounter (Signed)
ketorolac (TORADOL) 10 MG tablet   Pt still has not had his scan done, and is still in a lot of discomfort. Pt would like a refill sent to [facility].

## 2022-08-17 ENCOUNTER — Encounter

## 2022-08-17 ENCOUNTER — Inpatient Hospital Stay: Admit: 2022-08-17 | Payer: PRIVATE HEALTH INSURANCE | Attending: Family Medicine | Primary: Family Medicine

## 2022-08-17 DIAGNOSIS — R1011 Right upper quadrant pain: Secondary | ICD-10-CM

## 2022-08-17 MED ORDER — TECHNETIUM TC 99M MEBROFENIN IV KIT
Freq: Once | INTRAVENOUS | Status: AC | PRN
Start: 2022-08-17 — End: 2022-08-17
  Administered 2022-08-17: 12:00:00 5.3 via INTRAVENOUS

## 2022-08-18 NOTE — Telephone Encounter (Signed)
-----   Message from Rodney Booze, [place] sent at 08/17/2022  3:45 PM EDT -----  Subject: Referral Request    Reason for referral request? Patient states that he saw the ortho doc that   he was recently referred to. He was very unpleased with this doctor. He   would like to get a referral to the [employer] at [facility]. [facility]:   [phone]. Please advise   Provider patient wants to be referred to(if known):     Provider Phone Number(if known):    Additional Information for Provider?   ---------------------------------------------------------------------------  --------------  [place]    [phone]; OK to leave message on voicemail  ---------------------------------------------------------------------------  --------------

## 2022-08-18 NOTE — Telephone Encounter (Signed)
Patient called and notified to call [place] provider and request referral. Verbalizes understanding. Thanked for information.

## 2022-08-20 ENCOUNTER — Inpatient Hospital Stay: Admit: 2022-08-20 | Payer: PRIVATE HEALTH INSURANCE | Attending: Family Medicine | Primary: Family Medicine

## 2022-08-20 DIAGNOSIS — R1011 Right upper quadrant pain: Secondary | ICD-10-CM

## 2022-08-21 MED ORDER — FAMOTIDINE 40 MG PO TABS
40 MG | ORAL_TABLET | Freq: Every evening | ORAL | 3 refills | Status: AC
Start: 2022-08-21 — End: ?

## 2022-08-21 NOTE — Telephone Encounter (Signed)
Trial of famotidine, continue omeprazole

## 2022-08-21 NOTE — Telephone Encounter (Signed)
Pt states he is still in pain and would like to know if the Dr can recommend anything else that might help him. Please advise.

## 2022-08-21 NOTE — Addendum Note (Signed)
Addended by: Sampson Goon on: 08/21/2022 02:33 PM     Modules accepted: Orders

## 2022-08-25 NOTE — Telephone Encounter (Signed)
-----   Message from Dolores Hoose sent at 08/25/2022  3:08 PM EDT -----  Subject: Message to Provider    QUESTIONS  Information for Provider? Colletta [place] with [facility] called   requesting information on the Duplicate request for Ortho consultation.   please contact Colletta [place]   ---------------------------------------------------------------------------  --------------  [place]  [phone]; OK to leave message on voicemail  ---------------------------------------------------------------------------  --------------  SCRIPT ANSWERS  Relationship to Patient? Covered Entity  Covered Entity Type? Other  Other Covered Entity Type? [place]  Representative Name? Colletta

## 2022-08-25 NOTE — Telephone Encounter (Addendum)
Called [place] from [facility] , advised that the duplicate referral was due to the pt wanting to see a specialist at [facility] replied, ok, Thank You

## 2022-09-01 NOTE — Telephone Encounter (Signed)
On pt's radiology order: the diagnosis needs to be more detailed. They are also asking for the specific procedure needed for the nuclear test. Please advise.

## 2022-09-01 NOTE — Telephone Encounter (Signed)
Spoke to [place]. Advised her that the test has already been completed.

## 2022-09-12 ENCOUNTER — Ambulatory Visit: Payer: PRIVATE HEALTH INSURANCE | Primary: Family Medicine

## 2022-12-25 ENCOUNTER — Ambulatory Visit: Payer: PRIVATE HEALTH INSURANCE | Attending: Family Medicine | Primary: Family Medicine

## 2022-12-27 ENCOUNTER — Ambulatory Visit
Admit: 2022-12-27 | Discharge: 2022-12-27 | Payer: PRIVATE HEALTH INSURANCE | Attending: Family Medicine | Primary: Family Medicine

## 2022-12-27 DIAGNOSIS — E785 Hyperlipidemia, unspecified: Secondary | ICD-10-CM

## 2022-12-27 NOTE — Progress Notes (Signed)
Chief Complaint   Patient presents with    6 Month Follow-Up         "Have you been to the ER, urgent care clinic since your last visit?  Hospitalized since your last visit?"    NO    "Have you seen or consulted any other health care providers outside of [facility] since your last visit?"    [facility] Maintenance Due   Topic Date Due    Hepatitis B vaccine (1 of 3 - 3-dose series) Never done    COVID-19 Vaccine (1) Never done

## 2022-12-27 NOTE — Progress Notes (Signed)
[facility]  Chief Complaint   Patient presents with    6 Month Follow-Up       History of Present Illness:   Michael Gonzales is a 52 y.o. male       HPI:  Here for f/u [place]. Occur occasionally now, neck spasms. Tolerable.  Taking famotidine daily and that helps. RUQ much better. Changed diet.  Ortho thru [place]. Synvisc injections planned.  CRP and sed rate normal in 11/23. Advised no TKR at this time due to young age.    The 10-year ASCVD risk score (Arnett DK, et al., 2019) is: 5.5%   Health Maintenance  Health Maintenance Due   Topic Date Due    Hepatitis B vaccine (1 of 3 - 3-dose series) Never done    COVID-19 Vaccine (1) Never done       Past Medical, Family, and Social History:     Past Medical History:   Diagnosis Date    Calculus of kidney     Chronic low back pain     GERD (gastroesophageal reflux disease)     Headache     Vitamin D deficiency       Past Surgical History:   Procedure Laterality Date    ANTERIOR CRUCIATE LIGAMENT REPAIR Left     ORTHOPEDIC SURGERY      back surgery, R wrist       Current Outpatient Medications on File Prior to Visit   Medication Sig Dispense Refill    famotidine (PEPCID) 40 MG tablet Take 1 tablet by mouth every evening 30 tablet 3    ondansetron (ZOFRAN-ODT) 4 MG disintegrating tablet Place 1 tablet under the tongue every 8 hours as needed for Nausea or Vomiting 20 tablet 0    SUMAtriptan (IMITREX) 50 MG tablet Take one tablet at onset of headache then repeat after 1 hour if needed.  Max daily dose 3 tablets.  Max in a month 9 tablets. 9 tablet 0    omeprazole (PRILOSEC) 40 MG delayed release capsule Take 1 capsule by mouth every morning (before breakfast) 90 capsule 1    tiZANidine (ZANAFLEX) 4 MG tablet Take 1 tablet by mouth 3 times daily as needed (spasm) 90 tablet 2    tamsulosin (FLOMAX) 0.4 MG capsule Take 1 capsule by mouth as needed As needed      eletriptan (RELPAX) 40 MG tablet Take 1 tablet by mouth once as needed (headache) 6 tablet 5      No current facility-administered medications on file prior to visit.       Patient Active Problem List   Diagnosis    Migraine without aura and with status migrainosus, not intractable    Dyslipidemia    GERD (gastroesophageal reflux disease)    Back pain    Rotator cuff disorder    Vitamin D deficiency    Chronic pain of left knee    Complete tear, knee, anterior cruciate ligament    Obesity    Tear of meniscus of knee    Osteoarthritis of knee    Sensorineural hearing loss (SNHL)    Neck pain    Lateral epicondylitis       Social History     Socioeconomic History    Marital status: Single     Spouse name: None    Number of children: None    Years of education: None    Highest education level: None   Tobacco Use    Smoking status: Never  Smokeless tobacco: Never   Substance and Sexual Activity    Alcohol use: Not Currently    Drug use: Never     Social Determinants of Health     Financial Resource Strain: Low Risk  (12/27/2022)    Overall Financial Resource Strain (CARDIA)     Difficulty of Paying Living Expenses: Not hard at all   Food Insecurity: No Food Insecurity (12/27/2022)    Hunger Vital Sign     Worried About Running Out of Food in the Last Year: Never true     Ran Out of Food in the Last Year: Never true   Transportation Needs: No Transportation Needs (12/27/2022)    PRAPARE - Armed forces logistics/support/administrative officer (Medical): No     Lack of Transportation (Non-Medical): No   Housing Stability: Unknown (12/27/2022)    Housing Stability Vital Sign     Unable to Pay for Housing in the Last Year: No     Unstable Housing in the Last Year: No        Review of Systems   Review of Systems   Respiratory:  Negative for cough and shortness of breath.    Cardiovascular:  Negative for chest pain.   Musculoskeletal:  Positive for arthralgias.         Objective:   BP 130/85 (Site: Left Upper Arm, Position: Sitting, Cuff Size: Large Adult)   Pulse 68   Temp 97 F (36.1 C) (Oral)   Resp 14   Ht 1.854 m (6\' 1" )   Wt  130.9 kg (288 lb 9.6 oz)   SpO2 98%   BMI 38.08 kg/m    Physical Exam   PHYSICAL EXAM:  Gen: Pt sitting in chair, in NAD, obese  Head: Normocephalic, atraumatic  Eyes: Sclera anicteric, EOM grossly intact,  CVS: Normal S1, S2, no m/r/g  Resp: CTAB, no wheezes or rales  Neuro: Alert, oriented, appropriate    Pertinent Labs/Studies:  PHQ-9 Total Score: 0 (12/27/2022  3:50 PM)         Assessment and orders:       ICD-10-CM    1. Dyslipidemia  E78.5 Lipid Panel     Lipid Panel      2. Vitamin D deficiency  E55.9 Vitamin D 25 Hydroxy     Vitamin D 25 Hydroxy      3. Long term use of drug  Z79.899 Comprehensive Metabolic Panel     CBC with Auto Differential     CBC with Auto Differential     Comprehensive Metabolic Panel        1. Dyslipidemia  -     Lipid Panel; Future  2. Vitamin D deficiency  -     Vitamin D 25 Hydroxy; Future  3. Long term use of drug  -     Comprehensive Metabolic Panel; Future  -     CBC with Auto Differential; Future  Your cholesterol test was slightly elevated, follow a low-cholesterol diet and exercise.  Current treatment plan is effective, no change in therapy , lab results and schedule of future lab studies reviewed with patient, and reviewed diet, exercise and weight control  Return in about 6 months (around 06/27/2023).   I have discussed the diagnosis with the patient and the intended plan as seen in the above orders.  Social history, medical history, and labs were reviewed.  The patient has received an after-visit summary and questions were answered concerning future plans.  I have discussed  medication side effects and warnings with the patient as well. Patient verbalized understanding and accepts plan & risks.   Shirley Muscat, MD  [facility]  12/27/22

## 2022-12-28 ENCOUNTER — Encounter

## 2022-12-28 LAB — COMPREHENSIVE METABOLIC PANEL
ALT: 30 U/L (ref 12–78)
AST: 18 U/L (ref 15–37)
Albumin/Globulin Ratio: 1.3 (ref 1.1–2.2)
Albumin: 3.9 g/dL (ref 3.5–5.0)
Alk Phosphatase: 112 U/L (ref 45–117)
Anion Gap: 6 mmol/L (ref 5–15)
BUN: 13 MG/DL (ref 6–20)
Bun/Cre Ratio: 15 (ref 12–20)
CO2: 26 mmol/L (ref 21–32)
Calcium: 10 MG/DL (ref 8.5–10.1)
Chloride: 106 mmol/L (ref 97–108)
Creatinine: 0.86 MG/DL (ref 0.70–1.30)
Est, Glom Filt Rate: >60 mL/min/{1.73_m2} (ref >60)
Globulin: 3 g/dL (ref 2.0–4.0)
Glucose: 88 mg/dL (ref 65–100)
Potassium: 4.4 mmol/L (ref 3.5–5.1)
Sodium: 138 mmol/L (ref 136–145)
Total Bilirubin: 0.3 MG/DL (ref 0.2–1.0)
Total Protein: 6.9 g/dL (ref 6.4–8.2)

## 2022-12-28 LAB — CBC WITH AUTO DIFFERENTIAL
Absolute Immature Granulocyte: 0.1 10*3/uL — ABNORMAL HIGH (ref 0.00–0.04)
Basophils %: 1 % (ref 0–1)
Basophils Absolute: 0.1 10*3/uL (ref 0.0–0.1)
Eosinophils %: 2 % (ref 0–7)
Eosinophils Absolute: 0.3 10*3/uL (ref 0.0–0.4)
Hematocrit: 47.8 % (ref 36.6–50.3)
Hemoglobin: 15.6 g/dL (ref 12.1–17.0)
Immature Granulocytes: 1 % — ABNORMAL HIGH (ref 0.0–0.5)
Lymphocytes %: 24 % (ref 12–49)
Lymphocytes Absolute: 2.7 10*3/uL (ref 0.8–3.5)
MCH: 28.4 PG (ref 26.0–34.0)
MCHC: 32.6 g/dL (ref 30.0–36.5)
MCV: 86.9 FL (ref 80.0–99.0)
MPV: 10 FL (ref 8.9–12.9)
Monocytes %: 5 % (ref 5–13)
Monocytes Absolute: 0.6 10*3/uL (ref 0.0–1.0)
Neutrophils %: 67 % (ref 32–75)
Neutrophils Absolute: 7.3 10*3/uL (ref 1.8–8.0)
Nucleated RBCs: 0 PER 100 WBC
Platelets: 374 10*3/uL (ref 150–400)
RBC: 5.5 M/uL (ref 4.10–5.70)
RDW: 12.8 % (ref 11.5–14.5)
WBC: 10.9 10*3/uL (ref 4.1–11.1)
nRBC: 0 10*3/uL (ref 0.00–0.01)

## 2022-12-28 LAB — LIPID PANEL
Chol/HDL Ratio: 7.4 — ABNORMAL HIGH (ref 0.0–5.0)
Cholesterol, Total: 238 MG/DL — ABNORMAL HIGH (ref <200)
HDL: 32 MG/DL
LDL Calculated: 137.8 MG/DL — ABNORMAL HIGH (ref 0–100)
Triglycerides: 341 MG/DL — ABNORMAL HIGH (ref <150)
VLDL Cholesterol Calculated: 68.2 MG/DL

## 2022-12-28 LAB — VITAMIN D 25 HYDROXY: Vit D, 25-Hydroxy: 18 ng/mL — ABNORMAL LOW (ref 30–100)

## 2022-12-28 MED ORDER — VITAMIN D (ERGOCALCIFEROL) 1.25 MG (50000 UT) PO CAPS
1.25 | ORAL_CAPSULE | ORAL | 0 refills | 69.50000 days | Status: DC
Start: 2022-12-28 — End: 2024-11-11

## 2023-02-14 NOTE — Telephone Encounter (Signed)
Pt is out on the road ([place]) in [place]. Pt states he started itching and then noticed he has a rash all over his body, his hands and left eye are swollen as well. Pt was advised to seek care at the nearest ED. Pt says he's not allergic to anyting that he knows of and does not know where this came from. Pt states he will find the closest ED and update PCP afterward.

## 2023-06-27 ENCOUNTER — Ambulatory Visit: Payer: PRIVATE HEALTH INSURANCE | Attending: Family Medicine | Primary: Family Medicine

## 2023-07-02 ENCOUNTER — Ambulatory Visit: Payer: PRIVATE HEALTH INSURANCE | Attending: Family Medicine | Primary: Family Medicine

## 2023-07-16 ENCOUNTER — Ambulatory Visit
Admit: 2023-07-16 | Discharge: 2023-07-16 | Payer: PRIVATE HEALTH INSURANCE | Attending: Family Medicine | Primary: Family Medicine

## 2023-07-16 DIAGNOSIS — N200 Calculus of kidney: Secondary | ICD-10-CM

## 2023-07-16 MED ORDER — KETOROLAC TROMETHAMINE 60 MG/2ML IM SOLN
60 | Freq: Once | INTRAMUSCULAR | Status: AC
Start: 2023-07-16 — End: 2023-07-16
  Administered 2023-07-16: 15:00:00 60 mg via INTRAMUSCULAR

## 2023-07-16 NOTE — Telephone Encounter (Signed)
Patient coming in at 11

## 2023-07-16 NOTE — Telephone Encounter (Signed)
Pt states he was out of state and on 8-24 went to the ED in [place]. Pt was treated for a 4 mm kidney stone. Pt still has not passed the stone,  and is in a lot of pain. Pt has appt today with pcp. Pt states he needs a referral for Urology to take care of this issue. Pt would like to know if Dr can send in an emergent referral, or if the Dr needs to see him first. Please advise.

## 2023-07-16 NOTE — Progress Notes (Addendum)
[facility]  Chief Complaint   Patient presents with    ED FOLLOW UP     [place]- kidney stone.       History of Present Illness:   Michael Gonzales is a 52 y.o. male       HPI:  Here for ER f/u. Still having a lot of pain. Took percocet this am. Has zofran and flomax as well. H/o kidney stones.     Pt states he was out of state and on 8-24 went to the ED in [place]. Pt was treated for a 4 mm kidney stone. Pt still has not passed the stone,  and is in a lot of pain.       CT   KIDNEYS: No enhancing contour deforming renal mass lesion. No left-sided obstructive urolithiasis or hydronephrosis. Several small nonobstructing right renal stones. There is right renal edema surrounding inflammation. There is mild right hydronephrosis and hydroureter caused by 4 mm mid right ureteral stone (601:96).     Health Maintenance  Health Maintenance Due   Topic Date Due    Hepatitis B vaccine (1 of 3 - 19+ 3-dose series) Never done    COVID-19 Vaccine (1 - 2023-24 season) Never done    Flu vaccine (1) 06/21/2023       Past Medical, Family, and Social History:     Past Medical History:   Diagnosis Date    Calculus of kidney     Chronic low back pain     GERD (gastroesophageal reflux disease)     Headache     Vitamin D deficiency       Past Surgical History:   Procedure Laterality Date    ANTERIOR CRUCIATE LIGAMENT REPAIR Left     ORTHOPEDIC SURGERY      back surgery, R wrist       Current Outpatient Medications on File Prior to Visit   Medication Sig Dispense Refill    oxyCODONE-acetaminophen (PERCOCET) 10-325 MG per tablet Take 1 tablet by mouth every 8 hours as needed.      vitamin D (ERGOCALCIFEROL) 1.25 MG (50000 UT) CAPS capsule Take 1 capsule by mouth once a week 12 capsule 0    famotidine (PEPCID) 40 MG tablet Take 1 tablet by mouth every evening 30 tablet 3    ondansetron (ZOFRAN-ODT) 4 MG disintegrating tablet Place 1 tablet under the tongue every 8 hours as needed for Nausea or Vomiting 20  tablet 0    SUMAtriptan (IMITREX) 50 MG tablet Take one tablet at onset of headache then repeat after 1 hour if needed.  Max daily dose 3 tablets.  Max in a month 9 tablets. 9 tablet 0    eletriptan (RELPAX) 40 MG tablet Take 1 tablet by mouth once as needed (headache) 6 tablet 5    omeprazole (PRILOSEC) 40 MG delayed release capsule Take 1 capsule by mouth every morning (before breakfast) 90 capsule 1    tiZANidine (ZANAFLEX) 4 MG tablet Take 1 tablet by mouth 3 times daily as needed (spasm) 90 tablet 2    tamsulosin (FLOMAX) 0.4 MG capsule Take 1 capsule by mouth as needed As needed       No current facility-administered medications on file prior to visit.       Patient Active Problem List   Diagnosis    Migraine without aura and with status migrainosus, not intractable    Dyslipidemia    GERD (gastroesophageal reflux disease)    Back pain  Rotator cuff disorder    Vitamin D deficiency    Chronic pain of left knee    Complete tear, knee, anterior cruciate ligament    Obesity    Tear of meniscus of knee    Osteoarthritis of knee    Sensorineural hearing loss (SNHL)    Neck pain    Lateral epicondylitis       Social History     Socioeconomic History    Marital status: Single     Spouse name: None    Number of children: None    Years of education: None    Highest education level: None   Tobacco Use    Smoking status: Never    Smokeless tobacco: Never   Substance and Sexual Activity    Alcohol use: Not Currently    Drug use: Never     Social Determinants of Health     Financial Resource Strain: Low Risk  (12/27/2022)    Overall Financial Resource Strain (CARDIA)     Difficulty of Paying Living Expenses: Not hard at all   Food Insecurity: No Food Insecurity (12/27/2022)    Hunger Vital Sign     Worried About Running Out of Food in the Last Year: Never true     Ran Out of Food in the Last Year: Never true   Transportation Needs: Unknown (12/27/2022)    PRAPARE - Transportation     Lack of Transportation (Non-Medical): No    Housing Stability: Unknown (12/27/2022)    Housing Stability Vital Sign     Unstable Housing in the Last Year: No        Review of Systems   Review of Systems   Constitutional:  Negative for fever.   Gastrointestinal:  Positive for abdominal pain. Negative for nausea and vomiting.   Genitourinary:  Negative for decreased urine volume, difficulty urinating and frequency.         Objective:   BP 138/83 (Site: Right Upper Arm, Position: Sitting, Cuff Size: Large Adult)   Pulse 65   Temp 97.8 F (36.6 C) (Oral)   Resp 17   Ht 1.854 m (6\' 1" )   Wt 130.2 kg (287 lb)   SpO2 98%   BMI 37.87 kg/m    Physical Exam   PHYSICAL EXAM:  Gen: Pt sitting in chair, appear uncomfortable  Head: Normocephalic, atraumatic  Eyes: Sclera anicteric, EOM grossly intact,  CVS: Normal S1, S2, no m/r/g  Resp: CTAB, no wheezes or rales  Abd: Soft, non-distended, +BS, R sided CVAT  Neuro: Alert, oriented, appropriate        Assessment and orders:       ICD-10-CM    1. Nephrolithiasis  N20.0 ketorolac (TORADOL) injection 60 mg     External Referral To Urology          Assessment & Plan  Nephrolithiasis    Given 60 mg IM here, placed urgent referral for urology but advised patient that if symptoms worsen go back to ER    Orders:    ketorolac (TORADOL) injection 60 mg    External Referral To Urology  I reviewed medications and side effects in detail, labs reviewed from ER  Return if symptoms worsen or fail to improve.   I have discussed the diagnosis with the patient and the intended plan as seen in the above orders.  Social history, medical history, and labs were reviewed.  The patient has received an after-visit summary and questions were answered concerning future plans.  I have discussed medication side effects and warnings with the patient as well. Patient verbalized understanding and accepts plan & risks.        Marni Griffon, MD  [facility]  07/16/23

## 2023-10-25 ENCOUNTER — Ambulatory Visit
Admit: 2023-10-25 | Payer: PRIVATE HEALTH INSURANCE | Admitting: Student in an Organized Health Care Education/Training Program | Primary: Family Medicine

## 2023-10-25 VITALS — BP 134/83 | HR 62 | Temp 98.10000°F | Resp 18 | Ht 73.0 in | Wt 289.6 lb

## 2023-10-25 DIAGNOSIS — J189 Pneumonia, unspecified organism: Secondary | ICD-10-CM

## 2023-10-25 MED ORDER — GUAIFENESIN ER 600 MG PO TB12
600 | ORAL_TABLET | Freq: Two times a day (BID) | ORAL | 0 refills | Status: AC
Start: 2023-10-25 — End: 2023-11-04

## 2023-10-25 MED ORDER — AZITHROMYCIN 250 MG PO TABS
250 | ORAL_TABLET | ORAL | 0 refills | Status: AC
Start: 2023-10-25 — End: 2023-11-04

## 2023-10-25 NOTE — Progress Notes (Signed)
 [facility]   [facility]    Subjective:  CC:   Chief Complaint   Patient presents with    Chest Congestion     Cough, sneezing X6 days       HPI:  Michael Gonzales is 52 y.o. male who presents today for sick visit.

## 2023-10-25 NOTE — Progress Notes (Signed)
 I discussed the patient's history and physical exam with the resident. I reviewed the assessment and plan and agree with the resident's documentation.

## 2023-10-25 NOTE — Progress Notes (Signed)
 "  Have you been to the ER, urgent care clinic since your last visit?  Hospitalized since your last visit?"    NO    "Have you seen or consulted any other health care providers outside our system since your last visit?"    NO

## 2024-08-13 ENCOUNTER — Ambulatory Visit: Admit: 2024-08-13 | Discharge: 2024-08-13 | Payer: 59 | Attending: Family Medicine | Primary: Family Medicine

## 2024-08-13 MED ORDER — SHINGRIX 50 MCG/0.5ML IM SUSR
50 | INTRAMUSCULAR | 1 refills | Status: DC
Start: 2024-08-13 — End: 2024-11-11

## 2024-08-13 MED ORDER — TIZANIDINE HCL 4 MG PO TABS
4 | ORAL_TABLET | Freq: Three times a day (TID) | ORAL | 2 refills | Status: AC | PRN
Start: 2024-08-13 — End: ?

## 2024-08-13 MED ORDER — OMEPRAZOLE 40 MG PO CPDR
40 | ORAL_CAPSULE | Freq: Every day | ORAL | 1 refills | Status: AC
Start: 2024-08-13 — End: ?

## 2024-08-13 MED ORDER — ELETRIPTAN HYDROBROMIDE 40 MG PO TABS
40 | ORAL_TABLET | Freq: Once | ORAL | 5 refills | Status: DC | PRN
Start: 2024-08-13 — End: 2024-08-13

## 2024-08-13 MED ORDER — SEMAGLUTIDE-WEIGHT MANAGEMENT 0.25 MG/0.5ML SC SOAJ
0.25 | SUBCUTANEOUS | 0 refills | Status: DC
Start: 2024-08-13 — End: 2024-08-14

## 2024-08-13 MED ORDER — SUMATRIPTAN SUCCINATE 50 MG PO TABS
50 | ORAL_TABLET | ORAL | 0 refills | Status: AC
Start: 2024-08-13 — End: ?

## 2024-08-13 MED ORDER — PREVNAR 20 0.5 ML IM SUSY
0.5 | Freq: Once | INTRAMUSCULAR | 0 refills | Status: AC
Start: 2024-08-13 — End: 2024-08-13

## 2024-08-13 NOTE — Patient Instructions (Signed)
 Get shingrix  and prevnar 20  at [facility]

## 2024-08-13 NOTE — Progress Notes (Addendum)
 [facility]  Chief Complaint   Patient presents with    Follow-up     Chronic condtions       History of Present Illness:   Michael Gonzales is a 53 y.o. male       HPI:  Here for f/u migrane, obesity, knee pain, GERD.  Sees ortho thru [place].   Taking pepcid  or omeprazole  prn.    Interested in injectable GLP1 agonist for weight loss.  He has been working on his diet. Stops eating after 7pm. Around 2000 cal a day.  Skipping some meals. 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  Veggies - Green beans, corn, salads.  He has been walking more but is limited by his knee pain.  Ortho has advised that he needs to lose weight to help with his OA.       Health Maintenance  Health Maintenance Due   Topic Date Due    Hepatitis B vaccine (1 of 3 - 19+ 3-dose series) Never done    Shingles vaccine (1 of 2) Never done    Pneumococcal 50+ years Vaccine (1 of 1 - PCV) Never done    COVID-19 Vaccine (1 - 2024-25 season) Never done       Past Medical, Family, and Social History:     Past Medical History:   Diagnosis Date    Calculus of kidney     Chronic low back pain     GERD (gastroesophageal reflux disease)     Headache     Hearing loss     Obesity 11/21/2019    Vitamin D  deficiency       Past Surgical History:   Procedure Laterality Date    ANTERIOR CRUCIATE LIGAMENT REPAIR Left     ORTHOPEDIC SURGERY      back surgery, R wrist       Current Outpatient Medications on File Prior to Visit   Medication Sig Dispense Refill    vitamin D  (ERGOCALCIFEROL ) 1.25 MG (50000 UT) CAPS capsule Take 1 capsule by mouth once a week 12 capsule 0    famotidine  (PEPCID ) 40 MG tablet Take 1 tablet by mouth every evening (Patient taking differently: Take 1 tablet by mouth as needed) 30 tablet 3    ondansetron  (ZOFRAN -ODT) 4 MG disintegrating tablet Place 1 tablet under the tongue every 8 hours as needed for Nausea or Vomiting 20 tablet 0    tamsulosin (FLOMAX) 0.4 MG capsule Take 1 capsule by mouth as needed As needed       No current  facility-administered medications on file prior to visit.       Patient Active Problem List   Diagnosis    Migraine without aura and with status migrainosus, not intractable    Dyslipidemia    GERD (gastroesophageal reflux disease)    Back pain    Rotator cuff disorder    Vitamin D  deficiency    Chronic pain of left knee    Complete tear, knee, anterior cruciate ligament    Obesity    Tear of meniscus of knee    Osteoarthritis of knee    Sensorineural hearing loss (SNHL)    Neck pain    Lateral epicondylitis    Ureteric stone    Kidney stone       Social History     Socioeconomic History    Marital status: Single     Spouse name: None    Number of children: None    Years of education:  None    Highest education level: None   Tobacco Use    Smoking status: Never    Smokeless tobacco: Never   Substance and Sexual Activity    Alcohol use: Not Currently    Drug use: Never    Sexual activity: Not Currently     Partners: Female     Social Drivers of Health     Financial Resource Strain: Low Risk  (12/27/2022)    Overall Financial Resource Strain (CARDIA)     Difficulty of Paying Living Expenses: Not hard at all   Food Insecurity: No Food Insecurity (08/13/2024)    Hunger Vital Sign     Worried About Running Out of Food in the Last Year: Never true     Ran Out of Food in the Last Year: Never true   Transportation Needs: No Transportation Needs (08/13/2024)    PRAPARE - Therapist, art (Medical): No     Lack of Transportation (Non-Medical): No   Housing Stability: Low Risk  (08/13/2024)    Housing Stability Vital Sign     Unable to Pay for Housing in the Last Year: No     Number of Times Moved in the Last Year: 0     Homeless in the Last Year: No        Review of Systems   Review of Systems   HENT:  Negative for trouble swallowing and voice change.          Objective:   BP 133/85 (BP Site: Left Upper Arm, Patient Position: Sitting, BP Cuff Size: Large Adult)   Pulse 75   Temp 97.8 F (36.6 C) (Oral)    Resp 18   Ht 1.854 m (6' 1)   Wt 132 kg (291 lb)   SpO2 99%   BMI 38.39 kg/m    Physical Exam   PHYSICAL EXAM:  Gen: Pt sitting in chair, in NAD, obese  Head: Normocephalic, atraumatic  Eyes: Sclera anicteric, EOM grossly intact,  CVS: Normal S1, S2, no m/r/g  Resp: CTAB, no wheezes or rales  Neuro: Alert, oriented, appropriate    Pertinent Labs/Studies:  PHQ-9 Total Score: 0 (08/13/2024  9:53 AM)         Assessment and orders:       ICD-10-CM    1. Class 2 obesity due to excess calories without serious comorbidity with body mass index (BMI) of 38.0 to 38.9 in adult  E66.812 tirzepatide -weight management (ZEPBOUND ) 2.5 MG/0.5ML SOAJ subCUTAneous auto-injector pen    E66.09 DISCONTINUED: Semaglutide -Weight Management (WEGOVY ) 0.25 MG/0.5ML SOAJ SC injection    Z68.38       2. Migraine without aura and with status migrainosus, not intractable  G43.001 SUMAtriptan  (IMITREX ) 50 MG tablet     DISCONTINUED: eletriptan  (RELPAX ) 40 MG tablet      3. Gastroesophageal reflux disease without esophagitis  K21.9 omeprazole  (PRILOSEC) 40 MG delayed release capsule      4. Chronic pain of left knee  M25.562 tiZANidine  (ZANAFLEX ) 4 MG tablet    G89.29       5. Dyslipidemia  E78.5 Lipid Panel     Lipid Panel      6. Long term use of drug  Z79.899 Comprehensive Metabolic Panel     CBC with Auto Differential     Vitamin D  25 Hydroxy     Vitamin D  25 Hydroxy     CBC with Auto Differential     Comprehensive Metabolic Panel  7. Need for vaccination  Z23 Tdap, BOOSTRIX, (age 83 yrs+), IM     Influenza, FLUCELVAX Trivalent, (age 8 mo+) IM, Preservative Free, 0.5mL      8. Primary osteoarthritis of left knee  M17.12 tirzepatide -weight management (ZEPBOUND ) 2.5 MG/0.5ML SOAJ subCUTAneous auto-injector pen        Assessment & Plan  Migraine without aura and with status migrainosus, not intractable   Chronic, at goal (stable), continue current treatment plan    Orders:    SUMAtriptan  (IMITREX ) 50 MG tablet; Take one tablet at onset  of headache then repeat after 1 hour if needed.  Max daily dose 3 tablets.  Max in a month 9 tablets.    Gastroesophageal reflux disease without esophagitis   Chronic, at goal (stable), continue current treatment plan    Orders:    omeprazole  (PRILOSEC) 40 MG delayed release capsule; Take 1 capsule by mouth every morning (before breakfast)    Chronic pain of left knee   Monitored by specialist- no acute findings meriting change in the plan    Orders:    tiZANidine  (ZANAFLEX ) 4 MG tablet; Take 1 tablet by mouth 3 times daily as needed (spasm)    Class 2 obesity due to excess calories without serious comorbidity with body mass index (BMI) of 38.0 to 38.9 in adult   Chronic, not at goal (unstable), changes made today: start zepbound  and lifestyle modifications recommended  GLP-1 agonists are contraindicated in patients with a personal or family history of MTC or in patients with Multiple Endocrine Neoplasia syndrome type 2 (MEN 2). Counseled patient regarding the potential risk for MTC with the use of semaglutide  and informed them of symptoms of thyroid tumors (eg, a mass in the neck, dysphagia, dyspnea, persistent hoarseness).         Orders:    tirzepatide -weight management (ZEPBOUND ) 2.5 MG/0.5ML SOAJ subCUTAneous auto-injector pen; Inject 2.5 mg into the skin every 7 days    Dyslipidemia   Chronic, not at goal (stable), recheck labs today, advised diet/exercise  Lab Results   Component Value Date    LDL 137.8 (H) 12/27/2022       Orders:    Lipid Panel; Future    Long term use of drug       Orders:    Comprehensive Metabolic Panel; Future    CBC with Auto Differential; Future    Vitamin D  25 Hydroxy; Future    Need for vaccination       Orders:    Tdap, BOOSTRIX, (age 84 yrs+), IM    Influenza, FLUCELVAX Trivalent, (age 8 mo+) IM, Preservative Free, 0.5mL    Primary osteoarthritis of left knee   Monitored by specialist- no acute findings meriting change in the plan    Orders:    tirzepatide -weight management  (ZEPBOUND ) 2.5 MG/0.5ML SOAJ subCUTAneous auto-injector pen; Inject 2.5 mg into the skin every 7 days    Return in about 8 weeks (around 10/09/2024).   I have discussed the diagnosis with the patient and the intended plan as seen in the above orders.  Social history, medical history, and labs were reviewed.  The patient has received an after-visit summary and questions were answered concerning future plans.  I have discussed medication side effects and warnings with the patient as well. Patient verbalized understanding and accepts plan & risks.        Olam CHRISTELLA Parkin, MD  [facility]  08/14/24

## 2024-08-13 NOTE — Progress Notes (Signed)
 "  Have you been to the ER, urgent care clinic since your last visit?  Hospitalized since your last visit?"    NO    "Have you seen or consulted any other health care providers outside our system since your last visit?"    NO

## 2024-08-13 NOTE — Assessment & Plan Note (Deleted)
{  A/P Summary:[id number]}

## 2024-08-14 LAB — CBC WITH AUTO DIFFERENTIAL
Basophils %: 0.7 % (ref 0.0–1.0)
Basophils Absolute: 0.06 K/UL (ref 0.00–0.10)
Eosinophils %: 1.9 % (ref 0.0–7.0)
Eosinophils Absolute: 0.17 K/UL (ref 0.00–0.40)
Hematocrit: 48.4 % (ref 36.6–50.3)
Hemoglobin: 15.5 g/dL (ref 12.1–17.0)
Immature Granulocytes %: 0.7 % — ABNORMAL HIGH (ref 0.0–0.5)
Immature Granulocytes Absolute: 0.06 K/UL — ABNORMAL HIGH (ref 0.00–0.04)
Lymphocytes %: 24.3 % (ref 12.0–49.0)
Lymphocytes Absolute: 2.19 K/UL (ref 0.80–3.50)
MCH: 28.2 pg (ref 26.0–34.0)
MCHC: 32 g/dL (ref 30.0–36.5)
MCV: 88.2 FL (ref 80.0–99.0)
MPV: 10.4 FL (ref 8.9–12.9)
Monocytes %: 6.1 % (ref 5.0–13.0)
Monocytes Absolute: 0.55 K/UL (ref 0.00–1.00)
Neutrophils %: 66.3 % (ref 32.0–75.0)
Neutrophils Absolute: 6 K/UL (ref 1.80–8.00)
Nucleated RBCs: 0 /100{WBCs}
Platelets: 341 K/uL (ref 150–400)
RBC: 5.49 M/uL (ref 4.10–5.70)
RDW: 13.4 % (ref 11.5–14.5)
WBC: 9 K/uL (ref 4.1–11.1)
nRBC: 0 K/uL (ref 0.00–0.01)

## 2024-08-14 LAB — COMPREHENSIVE METABOLIC PANEL
ALT: 23 U/L (ref 10–50)
AST: 21 U/L (ref 10–50)
Albumin/Globulin Ratio: 1.5 (ref 1.1–2.2)
Albumin: 4 g/dL (ref 3.5–5.2)
Alk Phosphatase: 111 U/L (ref 40–129)
Anion Gap: 13 mmol/L (ref 2–14)
BUN: 14 mg/dL (ref 6–20)
CO2: 23 mmol/L (ref 20–29)
Calcium: 9.7 mg/dL (ref 8.6–10.0)
Chloride: 103 mmol/L (ref 98–107)
Creatinine: 0.89 mg/dL (ref 0.70–1.20)
Est, Glom Filt Rate: >90 ml/min/1.73m2 (ref >59)
Globulin: 2.6 g/dL (ref 2.0–4.0)
Glucose: 90 mg/dL (ref 65–100)
Potassium: 5.6 mmol/L — ABNORMAL HIGH (ref 3.5–5.1)
Sodium: 140 mmol/L (ref 136–145)
Total Bilirubin: 0.3 mg/dL (ref 0.0–1.2)
Total Protein: 6.6 g/dL (ref 6.4–8.3)

## 2024-08-14 LAB — LIPID PANEL
Chol/HDL Ratio: 6.5 — ABNORMAL HIGH (ref 0.0–5.0)
Cholesterol, Total: 200 mg/dL (ref 0–200)
HDL: 31 mg/dL — ABNORMAL LOW (ref 40–60)
LDL Cholesterol: 117 mg/dL — ABNORMAL HIGH (ref 0–100)
Triglycerides: 259 mg/dL — ABNORMAL HIGH (ref 0–150)
VLDL Cholesterol Calculated: 52 mg/dL

## 2024-08-14 LAB — VITAMIN D 25 HYDROXY: Vit D, 25-Hydroxy: 108 ng/mL — ABNORMAL HIGH (ref 30.0–100.0)

## 2024-08-14 MED ORDER — TIRZEPATIDE-WEIGHT MANAGEMENT 2.5 MG/0.5ML SC SOAJ
2.5 | SUBCUTANEOUS | 0 refills | 28.00000 days | Status: DC
Start: 2024-08-14 — End: 2024-09-11

## 2024-08-14 NOTE — Assessment & Plan Note (Signed)
 Monitored by specialist- no acute findings meriting change in the plan    Orders:    tirzepatide-weight management (ZEPBOUND) 2.5 MG/0.5ML SOAJ subCUTAneous auto-injector pen; Inject 2.5 mg into the skin every 7 days

## 2024-08-14 NOTE — Assessment & Plan Note (Addendum)
 Chronic, not at goal (unstable), changes made today: start zepbound  and lifestyle modifications recommended  GLP-1 agonists are contraindicated in patients with a personal or family history of MTC or in patients with Multiple Endocrine Neoplasia syndrome type 2 (MEN 2). Counseled patient regarding the potential risk for MTC with the use of semaglutide  and informed them of symptoms of thyroid tumors (eg, a mass in the neck, dysphagia, dyspnea, persistent hoarseness).         Orders:    tirzepatide -weight management (ZEPBOUND ) 2.5 MG/0.5ML SOAJ subCUTAneous auto-injector pen; Inject 2.5 mg into the skin every 7 days

## 2024-08-14 NOTE — Assessment & Plan Note (Addendum)
 Chronic, at goal (stable), continue current treatment plan    Orders:    SUMAtriptan  (IMITREX ) 50 MG tablet; Take one tablet at onset of headache then repeat after 1 hour if needed.  Max daily dose 3 tablets.  Max in a month 9 tablets.

## 2024-08-14 NOTE — Assessment & Plan Note (Addendum)
 Chronic, not at goal (stable), recheck labs today, advised diet/exercise  Lab Results   Component Value Date    LDL 137.8 (H) 12/27/2022       Orders:    Lipid Panel; Future

## 2024-08-14 NOTE — Addendum Note (Signed)
 Addended by: CORNELIOUS PLANAS on: 08/14/2024 01:24 PM     Modules accepted: Orders

## 2024-08-14 NOTE — Assessment & Plan Note (Addendum)
 Chronic, at goal (stable), continue current treatment plan    Orders:    omeprazole (PRILOSEC) 40 MG delayed release capsule; Take 1 capsule by mouth every morning (before breakfast)

## 2024-08-14 NOTE — Assessment & Plan Note (Addendum)
 Monitored by specialist- no acute findings meriting change in the plan    Orders:    tiZANidine  (ZANAFLEX ) 4 MG tablet; Take 1 tablet by mouth 3 times daily as needed (spasm)

## 2024-08-15 ENCOUNTER — Encounter

## 2024-08-25 ENCOUNTER — Encounter

## 2024-08-26 LAB — POTASSIUM: Potassium: 4.6 mmol/L (ref 3.5–5.1)

## 2024-09-11 ENCOUNTER — Encounter

## 2024-09-11 MED ORDER — TIRZEPATIDE-WEIGHT MANAGEMENT 5 MG/0.5ML SC SOAJ
5 | SUBCUTANEOUS | 0 refills | 28.00000 days | Status: DC
Start: 2024-09-11 — End: 2024-10-09

## 2024-09-11 NOTE — Telephone Encounter (Signed)
"  tirzepatide -weight management (ZEPBOUND ) 2.5 MG/0.5ML SOAJ subCUTAneous auto-injector pen   Please send to the [facility].  "

## 2024-09-11 NOTE — Telephone Encounter (Signed)
"  Increased zepbound  dose to 5 mg x 4 weeks  "

## 2024-10-09 ENCOUNTER — Ambulatory Visit: Admit: 2024-10-09 | Discharge: 2024-10-09 | Payer: 59 | Attending: Family Medicine | Primary: Family Medicine

## 2024-10-09 VITALS — BP 109/73 | HR 71 | Temp 97.70000°F | Resp 18 | Ht 73.0 in | Wt 267.4 lb

## 2024-10-09 DIAGNOSIS — Z6835 Body mass index (BMI) 35.0-35.9, adult: Principal | ICD-10-CM

## 2024-10-09 MED ORDER — TIRZEPATIDE-WEIGHT MANAGEMENT 5 MG/0.5ML SC SOAJ
5 | SUBCUTANEOUS | 5 refills | 28.00000 days | Status: DC
Start: 2024-10-09 — End: 2024-11-11

## 2024-10-09 NOTE — Progress Notes (Signed)
 "  Have you been to the ER, urgent care clinic since your last visit?  Hospitalized since your last visit?"    NO    "Have you seen or consulted any other health care providers outside our system since your last visit?"    NO

## 2024-10-09 NOTE — Progress Notes (Signed)
 "[place]  Chief Complaint   Patient presents with    Follow-up Chronic Condition       History of Present Illness:   Michael Gonzales is a 53 y.o. male       HPI:  Here for f/u obesity, OA. Started zepbound  for weight loss 2 months ago.SABRA  He has been working on his diet. Usually less than 2000 cal a day.  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  Veggies - Green beans, corn, salads.  Walking more, every day, 2 miles.   Ortho has advised that he needs to lose weight to help with his OA.  Gets gel shots at ortho every 6 months.    Home 262 today. He says he lost 20 lbs in the first month. Not as much weight loss on 5 mg as he did on the 2.5 mg initially.  No nausea, vomiting, diarrhea, constipation.     Had flu shot, prevnar and shingrix .    Health Maintenance  Health Maintenance Due   Topic Date Due    Hepatitis B vaccine (1 of 3 - 19+ 3-dose series) Never done    Pneumococcal 50+ years Vaccine (1 of 1 - PCV) Never done    COVID-19 Vaccine (1 - 2024-25 season) Never done       Past Medical, Family, and Social History:     Past Medical History:   Diagnosis Date    Calculus of kidney     Chronic low back pain     GERD (gastroesophageal reflux disease)     Headache     Hearing loss     Obesity 11/21/2019    Vitamin D  deficiency       Past Surgical History:   Procedure Laterality Date    ANTERIOR CRUCIATE LIGAMENT REPAIR Left     ORTHOPEDIC SURGERY      back surgery, R wrist       Current Outpatient Medications on File Prior to Visit   Medication Sig Dispense Refill    SUMAtriptan  (IMITREX ) 50 MG tablet Take one tablet at onset of headache then repeat after 1 hour if needed.  Max daily dose 3 tablets.  Max in a month 9 tablets. 9 tablet 0    omeprazole  (PRILOSEC) 40 MG delayed release capsule Take 1 capsule by mouth every morning (before breakfast) 90 capsule 1    tiZANidine  (ZANAFLEX ) 4 MG tablet Take 1 tablet by mouth 3 times daily as needed (spasm) 90 tablet 2    vitamin D  (ERGOCALCIFEROL ) 1.25 MG (50000  UT) CAPS capsule Take 1 capsule by mouth once a week 12 capsule 0    famotidine  (PEPCID ) 40 MG tablet Take 1 tablet by mouth every evening (Patient taking differently: Take 1 tablet by mouth as needed) 30 tablet 3    ondansetron  (ZOFRAN -ODT) 4 MG disintegrating tablet Place 1 tablet under the tongue every 8 hours as needed for Nausea or Vomiting 20 tablet 0    tamsulosin (FLOMAX) 0.4 MG capsule Take 1 capsule by mouth as needed As needed      zoster recombinant adjuvanted vaccine (SHINGRIX ) 50 MCG/0.5ML SUSR injection Inject 0.5 mLs into the muscle See Admin Instructions 1 dose now and repeat in 2-6 months (Patient not taking: Reported on 10/09/2024) 0.5 mL 1     No current facility-administered medications on file prior to visit.       Patient Active Problem List   Diagnosis    Migraine without aura and with status  migrainosus, not intractable    Dyslipidemia    GERD (gastroesophageal reflux disease)    Back pain    Rotator cuff disorder    Vitamin D  deficiency    Chronic pain of left knee    Complete tear, knee, anterior cruciate ligament    Obesity    Tear of meniscus of knee    Osteoarthritis of knee    Sensorineural hearing loss (SNHL)    Neck pain    Lateral epicondylitis    Ureteric stone    Kidney stone       Social History     Socioeconomic History    Marital status: Single     Spouse name: None    Number of children: None    Years of education: None    Highest education level: None   Tobacco Use    Smoking status: Never    Smokeless tobacco: Never   Substance and Sexual Activity    Alcohol use: Not Currently    Drug use: Never    Sexual activity: Not Currently     Partners: Female     Social Drivers of Health     Financial Resource Strain: Low Risk  (12/27/2022)    Overall Financial Resource Strain (CARDIA)     Difficulty of Paying Living Expenses: Not hard at all   Food Insecurity: No Food Insecurity (08/13/2024)    Hunger Vital Sign     Worried About Running Out of Food in the Last Year: Never true     Ran  Out of Food in the Last Year: Never true   Transportation Needs: No Transportation Needs (08/13/2024)    PRAPARE - Therapist, Art (Medical): No     Lack of Transportation (Non-Medical): No   Housing Stability: Low Risk  (08/13/2024)    Housing Stability Vital Sign     Unable to Pay for Housing in the Last Year: No     Number of Times Moved in the Last Year: 0     Homeless in the Last Year: No        Review of Systems   Review of Systems   HENT:  Negative for trouble swallowing and voice change.    Gastrointestinal:  Negative for constipation, diarrhea, nausea and vomiting.   Musculoskeletal:  Positive for arthralgias.         Objective:   BP 109/73 (BP Site: Left Upper Arm, Patient Position: Sitting, BP Cuff Size: Large Adult)   Pulse 71   Temp 97.7 F (36.5 C) (Oral)   Resp 18   Ht 1.854 m (6' 1)   Wt 121.3 kg (267 lb 6.4 oz)   SpO2 99%   BMI 35.28 kg/m    Physical Exam   PHYSICAL EXAM:  Gen: Pt sitting in chair, in NAD, obese  Head: Normocephalic, atraumatic  Eyes: Sclera anicteric, EOM grossly intact,  CVS: Normal S1, S2, no m/r/g  Resp: CTAB, no wheezes or rales  Neuro: Alert, oriented, appropriate      Assessment and orders:       ICD-10-CM    1. Class 2 severe obesity due to excess calories with serious comorbidity and body mass index (BMI) of 35.0 to 35.9 in adult  E66.812 tirzepatide -weight management (ZEPBOUND ) 5 MG/0.5ML SOAJ subCUTAneous auto-injector pen    Z68.35       2. Primary osteoarthritis of left knee  M17.12 tirzepatide -weight management (ZEPBOUND ) 5 MG/0.5ML SOAJ subCUTAneous auto-injector pen  Assessment & Plan  Class 2 severe obesity due to excess calories with serious comorbidity and body mass index (BMI) of 35.0 to 35.9 in adult   Chronic, not at goal (unstable), continue current treatment plan for now, if he does not lose more weight on 5 mg over next month will increase to 7.5 mg dose. Advised increase protein in diet to reduce risk of muscle loss  with GLP1    Orders:    tirzepatide -weight management (ZEPBOUND ) 5 MG/0.5ML SOAJ subCUTAneous auto-injector pen; Inject 5 mg into the skin every 7 days    Primary osteoarthritis of left knee   Monitored by specialist- no acute findings meriting change in the plan    Orders:    tirzepatide -weight management (ZEPBOUND ) 5 MG/0.5ML SOAJ subCUTAneous auto-injector pen; Inject 5 mg into the skin every 7 days    Return in about 3 months (around 01/09/2025).   I have discussed the diagnosis with the patient and the intended plan as seen in the above orders.  Social history, medical history, and labs were reviewed.  The patient has received an after-visit summary and questions were answered concerning future plans.  I have discussed medication side effects and warnings with the patient as well. Patient verbalized understanding and accepts plan & risks.        Olam CHRISTELLA Parkin, MD  [facility]  10/09/24    "

## 2024-10-09 NOTE — Assessment & Plan Note (Addendum)
"   Chronic, not at goal (unstable), continue current treatment plan for now, if he does not lose more weight on 5 mg over next month will increase to 7.5 mg dose. Advised increase protein in diet to reduce risk of muscle loss with GLP1    Orders:    tirzepatide -weight management (ZEPBOUND ) 5 MG/0.5ML SOAJ subCUTAneous auto-injector pen; Inject 5 mg into the skin every 7 days    "

## 2024-10-09 NOTE — Assessment & Plan Note (Addendum)
 Monitored by specialist- no acute findings meriting change in the plan    Orders:    tirzepatide-weight management (ZEPBOUND) 5 MG/0.5ML SOAJ subCUTAneous auto-injector pen; Inject 5 mg into the skin every 7 days

## 2024-11-07 ENCOUNTER — Telehealth

## 2024-11-07 NOTE — Telephone Encounter (Signed)
"  Pt was med flighted on 11-04-24 to [facility]. Pt was treated for a heart attack, and had a stent placed. Pt was Discharged on 11-06-24 Pt has a h/f w/pcp on 11-17-24. Pt needs a [facility]) Referral to Dr Alm Ruth with Cardiology Specialist at [facility] in [place]. Pt also needs to be seen sooner than 11-17-24 for his follow up and have his referral put in as a stat/urgent need, so that Dr Dorien office can schedule the pt's appointments. Please advise.   "

## 2024-11-10 NOTE — Telephone Encounter (Signed)
"  Patient called, appt scheduled.  "

## 2024-11-11 ENCOUNTER — Encounter

## 2024-11-11 ENCOUNTER — Ambulatory Visit: Admit: 2024-11-11 | Discharge: 2024-11-11 | Payer: 59 | Attending: Family Medicine | Primary: Family Medicine

## 2024-11-11 MED ORDER — TIRZEPATIDE-WEIGHT MANAGEMENT 5 MG/0.5ML SC SOAJ
5 | SUBCUTANEOUS | 5 refills | 28.00000 days | Status: DC
Start: 2024-11-11 — End: 2024-12-15

## 2024-11-11 NOTE — Patient Instructions (Signed)
"  You can start metoprolol. Call community care.  Ask your cardiology are you a candidate for cardiac rehab?  "

## 2024-11-11 NOTE — Progress Notes (Signed)
"  Chief Complaint   Patient presents with    Follow-Up from [facility] - heart attack         Have you been to the ER, urgent care clinic since your last visit?  Hospitalized since your last visit?    [facility]     Have you seen or consulted any other health care providers outside of [facility] since your last visit?    NO            Click Here for Release of Records Request     Health Maintenance Due   Topic Date Due    Hepatitis B vaccine (1 of 3 - 19+ 3-dose series) Never done    COVID-19 Vaccine (1 - 2024-25 season) Never done    Shingles vaccine (2 of 2) 10/20/2024       "

## 2024-11-11 NOTE — Progress Notes (Signed)
 "[facility]  Chief Complaint   Patient presents with    Follow-Up from [facility] - heart attack        History of Present Illness:   Michael Gonzales is a 53 y.o. male       HPI:  Here for hospital f/u. Had MI 11/04/24, had stent  in OM at [facility].  +cp  Thought it was heartburn but would not go away. Lightheaded, clammy, nausea. Syncope so family called 911.  Bp 60/30, [employer]. Pulse 40.    No FH CAD.    Started asa, metoprolol, prasugrel, crestor    Metoprolol 1/2 tab daily - plans to start if pulse 60.    Not on flomax now.        Health Maintenance  Health Maintenance Due   Topic Date Due    Hepatitis B vaccine (1 of 3 - 19+ 3-dose series) Never done    COVID-19 Vaccine (1 - 2024-25 season) Never done    Shingles vaccine (2 of 2) 10/20/2024       Past Medical, Family, and Social History:     Past Medical History:   Diagnosis Date    Calculus of kidney     Chronic low back pain     GERD (gastroesophageal reflux disease)     Headache     Hearing loss     Obesity 11/21/2019    Vitamin D  deficiency       Past Surgical History:   Procedure Laterality Date    ANTERIOR CRUCIATE LIGAMENT REPAIR Left     ORTHOPEDIC SURGERY      back surgery, R wrist       Current Outpatient Medications on File Prior to Visit   Medication Sig Dispense Refill    prasugrel (EFFIENT) 10 MG TABS Take 1 tablet by mouth      metoprolol succinate (TOPROL XL) 25 MG extended release tablet Take 0.5 tablets by mouth (Patient taking differently: Take 0.5 tablets by mouth Not to take if HR is below 60)      aspirin 81 MG EC tablet Take 1 tablet by mouth      rosuvastatin (CRESTOR) 40 MG tablet Take 0.5 tablets by mouth      tirzepatide -weight management (ZEPBOUND ) 5 MG/0.5ML SOAJ subCUTAneous auto-injector pen Inject 5 mg into the skin every 7 days (Patient taking differently: Inject 5 mg into the skin every 7 days Has not taken since heart attack since aroud 12/16) 2 mL 5    SUMAtriptan  (IMITREX ) 50  MG tablet Take one tablet at onset of headache then repeat after 1 hour if needed.  Max daily dose 3 tablets.  Max in a month 9 tablets. 9 tablet 0    omeprazole  (PRILOSEC) 40 MG delayed release capsule Take 1 capsule by mouth every morning (before breakfast) 90 capsule 1    tiZANidine  (ZANAFLEX ) 4 MG tablet Take 1 tablet by mouth 3 times daily as needed (spasm) 90 tablet 2    famotidine  (PEPCID ) 40 MG tablet Take 1 tablet by mouth every evening (Patient taking differently: Take 0.5 tablets by mouth as needed) 30 tablet 3    ondansetron  (ZOFRAN -ODT) 4 MG disintegrating tablet Place 1 tablet under the tongue every 8 hours as needed for Nausea or Vomiting 20 tablet 0     No current facility-administered medications on file prior to visit.       Patient Active Problem List   Diagnosis  Migraine without aura and with status migrainosus, not intractable    Dyslipidemia    GERD (gastroesophageal reflux disease)    Back pain    Rotator cuff disorder    Vitamin D  deficiency    Chronic pain of left knee    Complete tear, knee, anterior cruciate ligament    Obesity    Tear of meniscus of knee    Osteoarthritis of knee    Sensorineural hearing loss (SNHL)    Neck pain    Lateral epicondylitis    Ureteric stone    Kidney stone    ST elevation (STEMI) myocardial infarction of unspecified site [facility])       Social History     Socioeconomic History    Marital status: Single     Spouse name: None    Number of children: None    Years of education: None    Highest education level: None   Tobacco Use    Smoking status: Never    Smokeless tobacco: Never   Substance and Sexual Activity    Alcohol use: Not Currently    Drug use: Never    Sexual activity: Not Currently     Partners: Female     Social Drivers of Health     Financial Resource Strain: Low Risk  (12/27/2022)    Overall Financial Resource Strain (CARDIA)     Difficulty of Paying Living Expenses: Not hard at all   Food Insecurity: No Food Insecurity (08/13/2024)    Hunger Vital  Sign     Worried About Running Out of Food in the Last Year: Never true     Ran Out of Food in the Last Year: Never true   Transportation Needs: No Transportation Needs (08/13/2024)    PRAPARE - Therapist, Art (Medical): No     Lack of Transportation (Non-Medical): No   Housing Stability: Low Risk  (08/13/2024)    Housing Stability Vital Sign     Unable to Pay for Housing in the Last Year: No     Number of Times Moved in the Last Year: 0     Homeless in the Last Year: No        Review of Systems   Review of Systems   Constitutional:  Negative for diaphoresis.   Respiratory:  Negative for shortness of breath.    Cardiovascular:  Negative for chest pain.   Gastrointestinal:  Negative for nausea.         Objective:   BP 127/81   Pulse 76   Temp 97.6 F (36.4 C) (Oral)   Resp 16   Ht 1.854 m (6' 1)   Wt 115.7 kg (255 lb)   SpO2 98%   BMI 33.64 kg/m    Physical Exam   PHYSICAL EXAM:  Gen: Pt sitting in chair, in NAD  Head: Normocephalic, atraumatic  Eyes: Sclera anicteric, EOM grossly intact,  CVS: Normal S1, S2, no m/r/g  Resp: CTAB, no wheezes or rales  Neuro: Alert, oriented, appropriate           Assessment and orders:       ICD-10-CM    1. Coronary artery disease involving native coronary artery of native heart, unspecified whether angina present  I25.10       2. Hospital discharge follow-up  Z09 PR DISCHARGE MEDS RECONCILED W/ CURRENT OUTPATIENT MED LIST      3. Long term use of drug  Z79.899 Basic Metabolic Panel  CBC with Auto Differential      4. ST elevation myocardial infarction (STEMI), unspecified artery (HCC)  I21.3         Assessment & Plan  Coronary artery disease involving native coronary artery of native heart, unspecified whether angina present   Monitored by specialist- no acute findings meriting change in the plan         Hospital discharge follow-up       Orders:    PR DISCHARGE MEDS RECONCILED W/ CURRENT OUTPATIENT MED LIST    Long term use of drug        Orders:    Basic Metabolic Panel; Future    CBC with Auto Differential; Future    ST elevation myocardial infarction (STEMI), unspecified artery (HCC)   Monitored by specialist- no acute findings meriting change in the plan, referral placed last week to cards, advised [facility]       Justification for level of billing, time spent: 32 min with patient, reviewing chart and face to face exam, clinical documentation. Time prior to the visit was spent reviewing external notes results and imaging reports.  As well during the visit, time included evaluating the patient, discussing results and plans with the patient, and coordinating care.  As well, after the visit additional time spent documenting clinical care, interpreting results, and coordinating care.  This time was all spent during the date of service.    Return in about 5 weeks (around 12/18/2024).   I have discussed the diagnosis with the patient and the intended plan as seen in the above orders.  Social history, medical history, and labs were reviewed.  The patient has received an after-visit summary and questions were answered concerning future plans.  I have discussed medication side effects and warnings with the patient as well. Patient verbalized understanding and accepts plan & risks.        Olam CHRISTELLA Parkin, MD  [facility]  11/11/24      "

## 2024-11-11 NOTE — Assessment & Plan Note (Signed)
"   Monitored by specialist- no acute findings meriting change in the plan, referral placed last week to cards, advised [facility]       Justification for level of billing, time spent: 32 min with patient, reviewing chart and face to face exam, clinical documentation. Time prior to the visit was spent reviewing external notes results and imaging reports.  As well during the visit, time included evaluating the patient, discussing results and plans with the patient, and coordinating care.  As well, after the visit additional time spent documenting clinical care, interpreting results, and coordinating care.  This time was all spent during the date of service.    "

## 2024-11-11 NOTE — Telephone Encounter (Signed)
"    tirzepatide -weight management (ZEPBOUND ) 5 MG/0.5ML SOAJ subCUTAneous auto-injector pen   Pt took his last injection yesterday. Please send a refill to [facility].  "

## 2024-11-17 ENCOUNTER — Encounter: Payer: 59 | Attending: Family Medicine | Primary: Family Medicine

## 2024-11-25 MED ORDER — ASPIRIN 81 MG PO TBEC
81 | ORAL_TABLET | Freq: Every day | ORAL | 0 refills | 28.00000 days | Status: AC
Start: 2024-11-25 — End: ?

## 2024-11-25 MED ORDER — METOPROLOL SUCCINATE ER 25 MG PO TB24
25 | ORAL_TABLET | Freq: Every day | ORAL | 0 refills | 30.00000 days | Status: AC
Start: 2024-11-25 — End: ?

## 2024-11-25 MED ORDER — PRASUGREL HCL 10 MG PO TABS
10 | ORAL_TABLET | Freq: Every day | ORAL | 0 refills | Status: AC
Start: 2024-11-25 — End: ?

## 2024-11-25 MED ORDER — ROSUVASTATIN CALCIUM 20 MG PO TABS
20 | ORAL_TABLET | Freq: Every day | ORAL | 0 refills | 90.00000 days | Status: AC
Start: 2024-11-25 — End: ?

## 2024-11-25 NOTE — Telephone Encounter (Signed)
 Pt is still waiting for his cardiology referral to be processed so that he can see Dr Jama at [facility]. Pt needs an Rx's to be sent in so that he does not run out before being seen.   prasugrel  (EFFIENT ) 10 MG TABS   metoprolol  succinate (TOPROL  XL) 25 MG extended release tablet    aspirin  81 MG EC tablet   rosuvastatin  (CRESTOR ) 40 MG tablet   Please send Rx's to the [facility]. Pt has only 7 days worth left of meds, and these will be mailed from the [facility] pharmacy, so they need to be sent in asap. Please advise.

## 2024-11-25 NOTE — Telephone Encounter (Signed)
 Patient advised Rx sent. He verbalized understanding.

## 2024-12-15 ENCOUNTER — Telehealth

## 2024-12-15 MED ORDER — TIRZEPATIDE-WEIGHT MANAGEMENT 7.5 MG/0.5ML SC SOAJ
7.5 | SUBCUTANEOUS | 5 refills | Status: AC
Start: 2024-12-15 — End: ?

## 2024-12-15 NOTE — Telephone Encounter (Signed)
 Increase zepbound  dose to 7.5 mg

## 2024-12-15 NOTE — Telephone Encounter (Signed)
 Pt needs a refill on the below. He also wants it increased to 7.5. Please send to the [place].         [facility] [place], [place] - [address] [place] [phone] [address] [phone]  [address] [place], [place] [address]  Phone: [phone]  Fax: [phone]         tirzepatide -weight management (ZEPBOUND ) 5 MG/0.5ML SOAJ subCUTAneous auto-injector pen

## 2024-12-31 ENCOUNTER — Encounter: Payer: 59 | Attending: Family Medicine | Primary: Family Medicine
